# Patient Record
Sex: Female | Born: 1960 | Race: White | Hispanic: No | Marital: Married | Smoking: Current every day smoker
Health system: Southern US, Community
[De-identification: ages and names within clinical notes are randomized; demographics above are authoritative.]

## PROBLEM LIST (undated history)

## (undated) DIAGNOSIS — Z789 Other specified health status: Secondary | ICD-10-CM

## (undated) HISTORY — PX: APPENDECTOMY: SHX54

## (undated) HISTORY — PX: ABDOMINAL HYSTERECTOMY: SHX81

## (undated) HISTORY — PX: CHOLECYSTECTOMY: SHX55

---

## 2002-10-19 ENCOUNTER — Encounter: Payer: Self-pay | Admitting: Family Medicine

## 2002-10-19 ENCOUNTER — Ambulatory Visit (HOSPITAL_COMMUNITY): Admission: RE | Admit: 2002-10-19 | Discharge: 2002-10-19 | Payer: Self-pay | Admitting: Family Medicine

## 2002-10-27 ENCOUNTER — Ambulatory Visit (HOSPITAL_COMMUNITY): Admission: RE | Admit: 2002-10-27 | Discharge: 2002-10-27 | Payer: Self-pay | Admitting: Family Medicine

## 2002-10-27 ENCOUNTER — Encounter: Payer: Self-pay | Admitting: Family Medicine

## 2003-04-05 ENCOUNTER — Ambulatory Visit (HOSPITAL_COMMUNITY): Admission: RE | Admit: 2003-04-05 | Discharge: 2003-04-05 | Payer: Self-pay | Admitting: Family Medicine

## 2003-06-21 ENCOUNTER — Ambulatory Visit (HOSPITAL_COMMUNITY): Admission: RE | Admit: 2003-06-21 | Discharge: 2003-06-21 | Payer: Self-pay | Admitting: Internal Medicine

## 2004-01-31 ENCOUNTER — Ambulatory Visit (HOSPITAL_COMMUNITY): Admission: RE | Admit: 2004-01-31 | Discharge: 2004-01-31 | Payer: Self-pay | Admitting: Family Medicine

## 2005-03-27 ENCOUNTER — Ambulatory Visit (HOSPITAL_COMMUNITY): Admission: RE | Admit: 2005-03-27 | Discharge: 2005-03-27 | Payer: Self-pay | Admitting: Family Medicine

## 2006-07-16 ENCOUNTER — Ambulatory Visit (HOSPITAL_COMMUNITY): Admission: RE | Admit: 2006-07-16 | Discharge: 2006-07-16 | Payer: Self-pay | Admitting: Family Medicine

## 2006-08-03 ENCOUNTER — Encounter: Admission: RE | Admit: 2006-08-03 | Discharge: 2006-08-03 | Payer: Self-pay | Admitting: Family Medicine

## 2006-08-03 ENCOUNTER — Encounter (INDEPENDENT_AMBULATORY_CARE_PROVIDER_SITE_OTHER): Payer: Self-pay | Admitting: Specialist

## 2007-12-14 ENCOUNTER — Encounter: Admission: RE | Admit: 2007-12-14 | Discharge: 2007-12-14 | Payer: Self-pay | Admitting: Family Medicine

## 2009-04-04 ENCOUNTER — Encounter: Admission: RE | Admit: 2009-04-04 | Discharge: 2009-04-04 | Payer: Self-pay | Admitting: Family Medicine

## 2010-06-16 ENCOUNTER — Encounter: Payer: Self-pay | Admitting: Family Medicine

## 2010-07-18 ENCOUNTER — Other Ambulatory Visit: Payer: Self-pay | Admitting: Family Medicine

## 2010-07-18 DIAGNOSIS — Z1231 Encounter for screening mammogram for malignant neoplasm of breast: Secondary | ICD-10-CM

## 2010-08-14 ENCOUNTER — Ambulatory Visit: Payer: Self-pay

## 2010-08-28 ENCOUNTER — Ambulatory Visit
Admission: RE | Admit: 2010-08-28 | Discharge: 2010-08-28 | Disposition: A | Discharge status: Home / Self Care | Payer: BC Managed Care – PPO | Source: Ambulatory Visit | Attending: Family Medicine | Admitting: Family Medicine

## 2010-08-28 DIAGNOSIS — Z1231 Encounter for screening mammogram for malignant neoplasm of breast: Secondary | ICD-10-CM

## 2010-11-13 ENCOUNTER — Other Ambulatory Visit (HOSPITAL_COMMUNITY): Payer: Self-pay | Admitting: Family Medicine

## 2010-11-13 ENCOUNTER — Ambulatory Visit (HOSPITAL_COMMUNITY)
Admission: RE | Admit: 2010-11-13 | Discharge: 2010-11-13 | Disposition: A | Discharge status: Home / Self Care | Payer: BC Managed Care – PPO | Source: Ambulatory Visit | Attending: Family Medicine | Admitting: Family Medicine

## 2010-11-13 DIAGNOSIS — S2341XA Sprain of ribs, initial encounter: Secondary | ICD-10-CM

## 2010-11-13 DIAGNOSIS — R05 Cough: Secondary | ICD-10-CM

## 2010-11-13 DIAGNOSIS — R079 Chest pain, unspecified: Secondary | ICD-10-CM | POA: Insufficient documentation

## 2010-11-13 DIAGNOSIS — J209 Acute bronchitis, unspecified: Secondary | ICD-10-CM

## 2011-09-02 ENCOUNTER — Other Ambulatory Visit (HOSPITAL_COMMUNITY): Payer: Self-pay | Admitting: Internal Medicine

## 2011-09-02 DIAGNOSIS — Z139 Encounter for screening, unspecified: Secondary | ICD-10-CM

## 2011-09-08 ENCOUNTER — Ambulatory Visit (HOSPITAL_COMMUNITY): Payer: BC Managed Care – PPO

## 2011-12-24 ENCOUNTER — Telehealth: Payer: Self-pay

## 2011-12-24 NOTE — Telephone Encounter (Signed)
LMOM to call.

## 2011-12-29 ENCOUNTER — Other Ambulatory Visit: Payer: Self-pay | Admitting: Internal Medicine

## 2011-12-29 DIAGNOSIS — Z1231 Encounter for screening mammogram for malignant neoplasm of breast: Secondary | ICD-10-CM

## 2011-12-29 NOTE — Telephone Encounter (Signed)
LMOM to call.

## 2011-12-30 ENCOUNTER — Telehealth: Payer: Self-pay | Admitting: *Deleted

## 2011-12-30 NOTE — Telephone Encounter (Signed)
Letter to pt and PCP.  

## 2011-12-30 NOTE — Telephone Encounter (Signed)
LMOM to call.

## 2011-12-30 NOTE — Telephone Encounter (Signed)
[name] called today to speak with you about setting up her colonoscopy. Please call her on her cell phone. Thanks.

## 2012-01-01 ENCOUNTER — Telehealth: Payer: Self-pay

## 2012-01-01 DIAGNOSIS — Z139 Encounter for screening, unspecified: Secondary | ICD-10-CM

## 2012-01-01 NOTE — Telephone Encounter (Signed)
See phone note of 01/01/2012. Pt waiting on September schedule.

## 2012-01-05 ENCOUNTER — Other Ambulatory Visit: Payer: Self-pay

## 2012-01-05 DIAGNOSIS — Z139 Encounter for screening, unspecified: Secondary | ICD-10-CM

## 2012-01-05 NOTE — Telephone Encounter (Signed)
LMOM that pt has been scheduled with Dr. Darrick Penna on 02/10/2012 @ 1:10 PM. Please call and let me know which pharmacy to send Rx to and go over some of the instructions.

## 2012-01-05 NOTE — Telephone Encounter (Signed)
Gastroenterology Pre-Procedure Form     Request Date: 01/05/2012      Requesting Physician: Dr. Phillips Odor     PATIENT INFORMATION:  Wendy Bryan is a 51 y.o., female (DOB=[birth year]).  PROCEDURE: Procedure(s) requested: colonoscopy Procedure Reason: screening for colon cancer  PATIENT REVIEW QUESTIONS: The patient reports the following:   1. Diabetes Melitis: no 2. Joint replacements in the past 12 months: no 3. Major health problems in the past 3 months: no 4. Has an artificial valve or MVP:no 5. Has been advised in past to take antibiotics in advance of a procedure like teeth cleaning: no}    MEDICATIONS & ALLERGIES:    Patient reports the following regarding taking any blood thinners:   Plavix? no Aspirin?yes  Coumadin?  no  Patient confirms/reports the following medications:  Current Outpatient Prescriptions  Medication Sig Dispense Refill  . ALPRAZolam (XANAX) 0.5 MG tablet Take 0.5 mg by mouth at bedtime as needed. Pt said she only takes one at bedtime      . naproxen sodium (ANAPROX) 220 MG tablet Take 220 mg by mouth 2 (two) times daily with a meal. Only as needed        Patient confirms/reports the following allergies:  Allergies  Allergen Reactions  . Demerol (Meperidine) Nausea Only    Patient is appropriate to schedule for requested procedure(s): yes  AUTHORIZATION INFORMATION Primary Insurance: ID #:  Group #:  Pre-Cert / Auth required: Pre-Cert / Auth #:   Secondary Insurance: ID #: Group #:  Pre-Cert / Auth required: Pre-Cert / Auth #:  No orders of the defined types were placed in this encounter.    SCHEDULE INFORMATION: Procedure has been scheduled as follows:  Date: 02/10/2012     Time: 1:10 PM  Location: [facility] Short Stay  This Gastroenterology Pre-Precedure Form is being routed to the following provider(s) for review: Jonette Eva, MD

## 2012-01-05 NOTE — Telephone Encounter (Signed)
OK to proceed with colonoscopy.

## 2012-01-05 NOTE — Telephone Encounter (Signed)
Routing to [employer]. Pt called and said she would prefer Dr. Jena Gauss, she just doesn't know Dr. Darrick Penna.

## 2012-01-07 MED ORDER — PEG 3350-KCL-NA BICARB-NACL 420 G PO SOLR: 4000.0000 L | ORAL | Status: AC

## 2012-01-07 NOTE — Telephone Encounter (Signed)
Pt requested to reschedule to 02/11/2012 @ 8:30 AM with RMR. Wendy Bryan is aware.

## 2012-01-07 NOTE — Telephone Encounter (Signed)
Rx sent to [facility]. Instructions mailed to pt.

## 2012-01-14 ENCOUNTER — Encounter (INDEPENDENT_AMBULATORY_CARE_PROVIDER_SITE_OTHER): Payer: Self-pay | Admitting: *Deleted

## 2012-01-20 ENCOUNTER — Ambulatory Visit
Admission: RE | Admit: 2012-01-20 | Discharge: 2012-01-20 | Disposition: A | Discharge status: Home / Self Care | Payer: BC Managed Care – PPO | Source: Ambulatory Visit | Attending: Internal Medicine | Admitting: Internal Medicine

## 2012-01-20 DIAGNOSIS — Z1231 Encounter for screening mammogram for malignant neoplasm of breast: Secondary | ICD-10-CM

## 2012-01-22 ENCOUNTER — Telehealth: Payer: Self-pay

## 2012-01-22 NOTE — Telephone Encounter (Signed)
Pt called to reschedule her colonoscopy with Dr. Jena Gauss on 02/11/2012 to 02/19/2012 @ 9:45 AM due to her work schedule. Selena Batten is aware. I will mail pt new instructions and her Rx was already sent to the Pharmacy.

## 2012-01-23 ENCOUNTER — Other Ambulatory Visit: Payer: Self-pay | Admitting: Internal Medicine

## 2012-01-23 DIAGNOSIS — R928 Other abnormal and inconclusive findings on diagnostic imaging of breast: Secondary | ICD-10-CM

## 2012-01-27 ENCOUNTER — Ambulatory Visit
Admission: RE | Admit: 2012-01-27 | Discharge: 2012-01-27 | Disposition: A | Discharge status: Home / Self Care | Payer: BC Managed Care – PPO | Source: Ambulatory Visit | Attending: Internal Medicine | Admitting: Internal Medicine

## 2012-01-27 DIAGNOSIS — R928 Other abnormal and inconclusive findings on diagnostic imaging of breast: Secondary | ICD-10-CM

## 2012-02-09 ENCOUNTER — Encounter (HOSPITAL_COMMUNITY): Payer: Self-pay | Admitting: Pharmacy Technician

## 2012-02-10 ENCOUNTER — Ambulatory Visit: Admit: 2012-02-10 | Payer: Self-pay | Admitting: Gastroenterology

## 2012-02-10 SURGERY — COLONOSCOPY
Anesthesia: Moderate Sedation

## 2012-02-11 ENCOUNTER — Telehealth: Payer: Self-pay

## 2012-02-11 NOTE — Telephone Encounter (Signed)
LMOM to call to update triage prior to colonoscopy on 02/19/2012.

## 2012-02-12 NOTE — Telephone Encounter (Signed)
Pt called to update triage. She has not had any new problems and no change in meds since she was first triaged on 01/05/2012. She has her instructions and is aware Rx was sent to the pharmacy.

## 2012-02-12 NOTE — Telephone Encounter (Signed)
OK to proceed with colonoscopy.

## 2012-02-18 MED ORDER — SODIUM CHLORIDE 0.45 % IV SOLN
INTRAVENOUS | Status: DC
  Administered 2012-02-19: 11:00:00 via INTRAVENOUS

## 2012-02-19 ENCOUNTER — Ambulatory Visit (HOSPITAL_COMMUNITY)
Admission: RE | Admit: 2012-02-19 | Discharge: 2012-02-19 | Disposition: A | Discharge status: Home / Self Care | Payer: BC Managed Care – PPO | Source: Ambulatory Visit | Attending: Internal Medicine | Admitting: Internal Medicine

## 2012-02-19 ENCOUNTER — Encounter (HOSPITAL_COMMUNITY)
Admission: RE | Disposition: A | Discharge status: Home / Self Care | Payer: Self-pay | Source: Ambulatory Visit | Attending: Internal Medicine

## 2012-02-19 ENCOUNTER — Encounter (HOSPITAL_COMMUNITY): Payer: Self-pay | Admitting: *Deleted

## 2012-02-19 DIAGNOSIS — K5730 Diverticulosis of large intestine without perforation or abscess without bleeding: Secondary | ICD-10-CM

## 2012-02-19 DIAGNOSIS — D126 Benign neoplasm of colon, unspecified: Secondary | ICD-10-CM | POA: Insufficient documentation

## 2012-02-19 DIAGNOSIS — Z1211 Encounter for screening for malignant neoplasm of colon: Secondary | ICD-10-CM

## 2012-02-19 DIAGNOSIS — Z139 Encounter for screening, unspecified: Secondary | ICD-10-CM

## 2012-02-19 HISTORY — DX: Other specified health status: Z78.9

## 2012-02-19 HISTORY — PX: COLONOSCOPY: SHX5424

## 2012-02-19 SURGERY — COLONOSCOPY
Anesthesia: Moderate Sedation

## 2012-02-19 MED ORDER — SODIUM CHLORIDE 0.9 % IJ SOLN
INTRAMUSCULAR | Status: AC
  Filled 2012-02-19: qty 10

## 2012-02-19 MED ORDER — MIDAZOLAM HCL 5 MG/5ML IJ SOLN
INTRAMUSCULAR | Status: DC | PRN
  Administered 2012-02-19 (×2): 2 mg via INTRAVENOUS
  Administered 2012-02-19 (×2): 1 mg via INTRAVENOUS

## 2012-02-19 MED ORDER — STERILE WATER FOR IRRIGATION IR SOLN
Status: DC | PRN
  Administered 2012-02-19: 12:00:00

## 2012-02-19 MED ORDER — PROMETHAZINE HCL 25 MG/ML IJ SOLN
25.0000 mg | Freq: Once | INTRAMUSCULAR | Status: AC
  Administered 2012-02-19: 25 mg via INTRAVENOUS

## 2012-02-19 MED ORDER — FENTANYL CITRATE 0.05 MG/ML IJ SOLN
INTRAMUSCULAR | Status: AC
  Filled 2012-02-19: qty 4

## 2012-02-19 MED ORDER — FENTANYL CITRATE 0.05 MG/ML IJ SOLN
INTRAMUSCULAR | Status: DC | PRN
  Administered 2012-02-19: 50 ug via INTRAVENOUS
  Administered 2012-02-19 (×2): 25 ug via INTRAVENOUS

## 2012-02-19 MED ORDER — PROMETHAZINE HCL 25 MG/ML IJ SOLN
INTRAMUSCULAR | Status: AC
  Filled 2012-02-19: qty 1

## 2012-02-19 MED ORDER — MIDAZOLAM HCL 5 MG/5ML IJ SOLN
INTRAMUSCULAR | Status: AC
  Filled 2012-02-19: qty 10

## 2012-02-19 NOTE — Op Note (Signed)
[facility] [address] [place], [address]   COLONOSCOPY PROCEDURE REPORT  PATIENT: Wendy Bryan, Wendy Bryan  MR#:         [id number] BIRTHDATE: [birth year] , 51  yrs. old GENDER: Female ENDOSCOPIST: R.  Roetta Sessions, MD FACP FACG REFERRED BY:  Assunta Found, M.D. PROCEDURE DATE:  02/19/2012 PROCEDURE:     colonoscopy with snare polypectomy and biopsy  INDICATIONS: First ever average risk screening colonoscopy  INFORMED CONSENT:  The risks, benefits, alternatives and imponderables including but not limited to bleeding, perforation as well as the possibility of a missed lesion have been reviewed.  The potential for biopsy, lesion removal, etc. have also been discussed.  Questions have been answered.  All parties agreeable. Please see the history and physical in the medical record for more information.  MEDICATIONS: Fenatnyl 100 mcg IV and Versed 6 mg IV in divided doses. Phenergan 25 mg IV  DESCRIPTION OF PROCEDURE:  After a digital rectal exam was performed, the EC-[id number] ([id number]) and Pentax Colonoscope [id number] colonoscope was advanced from the anus through the rectum and colon to the area of the cecum, ileocecal valve and appendiceal orifice. The cecum was deeply intubated.  These structures were well-seen and photographed for the record.  From the level of the cecum and ileocecal valve, the scope was slowly and cautiously withdrawn. The mucosal surfaces were carefully surveyed utilizing scope tip deflection to facilitate fold flattening as needed.  The scope was pulled down into the rectum where a thorough examination including retroflexion was performed.    FINDINGS:  suboptimal preparation. External hemorrhoids and anal papilla; otherwise, normal rectum. Scattered left-sided diverticula. Patient had a 3 x 5 mm flat polyp in the base of cecum with another diminutive adjacent cecal polyp; the remainder of the clot this appeared normal.  THERAPEUTIC /  DIAGNOSTIC MANEUVERS PERFORMED:  the larger polyp in the cecum was hot snare removed. A smaller polyp was cold biopsied/removed  COMPLICATIONS: none  CECAL WITHDRAWAL TIME:  12 minutes  IMPRESSION:  Colonic diverticulosis. Cecal polyps-removed as described above  RECOMMENDATIONS: Followup on pathology.   _______________________________ eSigned:  R. Roetta Sessions, MD FACP [facility] 02/19/2012 12:14 PM   CC:    PATIENT NAME:  Wendy Bryan, Wendy Bryan MR#: [id number]

## 2012-02-19 NOTE — H&P (Signed)
  Primary Care Physician:  Colette Ribas, MD Primary Gastroenterologist:  Dr. Jena Gauss  Pre-Procedure History & Physical: HPI:  Wendy Bryan is a 51 y.o. female is here for a screening colonoscopy. No bowel symptoms. No family history of colon cancer. Father may have had polyps but at an advanced age. No prior colonoscopy.  Past Medical History  Diagnosis Date  . No pertinent past medical history     Past Surgical History  Procedure Date  . Abdominal hysterectomy   . Appendectomy   . Cholecystectomy     Prior to Admission medications   Medication Sig Start Date End Date Taking? Authorizing Provider  ALPRAZolam Prudy Feeler) 0.5 MG tablet Take 0.5 mg by mouth at bedtime as needed. Pt said she only takes one at bedtime   Yes Historical Provider, MD  naproxen sodium (ANAPROX) 220 MG tablet Take 220 mg by mouth 2 (two) times daily with a meal. Only as needed   Yes Historical Provider, MD    Allergies as of 01/05/2012 - Review Complete 01/05/2012  Allergen Reaction Noted  . Demerol (meperidine) Nausea Only 01/05/2012    Family History  Problem Relation Age of Onset  . Colon cancer Neg Hx     History   Social History  . Marital Status: Married    Spouse Name: N/A    Number of Children: N/A  . Years of Education: N/A   Occupational History  . Not on file.   Social History Main Topics  . Smoking status: Current Every Day Smoker -- 1.0 packs/day for 30 years    Types: Cigarettes  . Smokeless tobacco: Not on file  . Alcohol Use: Yes     occsional beer and wine  . Drug Use: No  . Sexually Active:    Other Topics Concern  . Not on file   Social History Narrative  . No narrative on file    Review of Systems: See HPI, otherwise negative ROS  Physical Exam: BP 129/90  Pulse 83  Temp 98.2 F (36.8 C) (Oral)  Resp 14  Ht 5' (1.524 m)  Wt 115 lb (52.164 kg)  BMI 22.46 kg/m2  SpO2 99% General:   Alert,  Well-developed, well-nourished, pleasant and cooperative  in NAD Head:  Normocephalic and atraumatic. Eyes:  Sclera clear, no icterus.   Conjunctiva pink. Ears:  Normal auditory acuity. Nose:  No deformity, discharge,  or lesions. Mouth:  No deformity or lesions, dentition normal. Neck:  Supple; no masses or thyromegaly. Lungs:  Clear throughout to auscultation.   No wheezes, crackles, or rhonchi. No acute distress. Heart:  Regular rate and rhythm; no murmurs, clicks, rubs,  or gallops. Abdomen:  Soft, nontender and nondistended. No masses, hepatosplenomegaly or hernias noted. Normal bowel sounds, without guarding, and without rebound.   Msk:  Symmetrical without gross deformities. Normal posture. Pulses:  Normal pulses noted. Extremities:  Without clubbing or edema. Neurologic:  Alert and  oriented x4;  grossly normal neurologically. Skin:  Intact without significant lesions or rashes. Cervical Nodes:  No significant cervical adenopathy. Psych:  Alert and cooperative. Normal mood and affect.  Impression/Plan: Wendy Bryan is now here to undergo a screening colonoscopy.  First ever average risk screening examination.  Risks, benefits, limitations, imponderables and alternatives regarding colonoscopy have been reviewed with the patient. Questions have been answered. All parties agreeable.

## 2012-02-22 ENCOUNTER — Encounter: Payer: Self-pay | Admitting: Internal Medicine

## 2012-02-23 ENCOUNTER — Encounter: Payer: Self-pay | Admitting: *Deleted

## 2012-02-25 ENCOUNTER — Encounter (HOSPITAL_COMMUNITY): Payer: Self-pay | Admitting: Internal Medicine

## 2012-09-29 ENCOUNTER — Ambulatory Visit (HOSPITAL_COMMUNITY)
Admission: RE | Admit: 2012-09-29 | Discharge: 2012-09-29 | Disposition: A | Discharge status: Home / Self Care | Payer: BC Managed Care – PPO | Source: Ambulatory Visit | Attending: Family Medicine | Admitting: Family Medicine

## 2012-09-29 ENCOUNTER — Other Ambulatory Visit (HOSPITAL_COMMUNITY): Payer: Self-pay | Admitting: Family Medicine

## 2012-09-29 DIAGNOSIS — S8990XA Unspecified injury of unspecified lower leg, initial encounter: Secondary | ICD-10-CM | POA: Insufficient documentation

## 2012-09-29 DIAGNOSIS — M79609 Pain in unspecified limb: Secondary | ICD-10-CM | POA: Insufficient documentation

## 2012-09-29 DIAGNOSIS — S99929A Unspecified injury of unspecified foot, initial encounter: Secondary | ICD-10-CM | POA: Insufficient documentation

## 2012-09-29 DIAGNOSIS — M25572 Pain in left ankle and joints of left foot: Secondary | ICD-10-CM

## 2012-09-29 DIAGNOSIS — X58XXXA Exposure to other specified factors, initial encounter: Secondary | ICD-10-CM | POA: Insufficient documentation

## 2013-02-02 ENCOUNTER — Other Ambulatory Visit: Payer: Self-pay

## 2013-02-02 DIAGNOSIS — Z1231 Encounter for screening mammogram for malignant neoplasm of breast: Secondary | ICD-10-CM

## 2013-03-02 ENCOUNTER — Ambulatory Visit: Payer: BC Managed Care – PPO

## 2013-03-23 ENCOUNTER — Other Ambulatory Visit (HOSPITAL_COMMUNITY): Payer: Self-pay | Admitting: Physician Assistant

## 2013-03-23 ENCOUNTER — Other Ambulatory Visit: Payer: Self-pay | Admitting: Physician Assistant

## 2013-03-23 DIAGNOSIS — N63 Unspecified lump in unspecified breast: Secondary | ICD-10-CM

## 2013-04-12 ENCOUNTER — Ambulatory Visit: Payer: BC Managed Care – PPO

## 2013-09-01 ENCOUNTER — Ambulatory Visit (INDEPENDENT_AMBULATORY_CARE_PROVIDER_SITE_OTHER): Payer: BC Managed Care – PPO | Admitting: Otolaryngology

## 2013-09-01 DIAGNOSIS — H6120 Impacted cerumen, unspecified ear: Secondary | ICD-10-CM

## 2013-09-01 DIAGNOSIS — H903 Sensorineural hearing loss, bilateral: Secondary | ICD-10-CM

## 2013-09-01 DIAGNOSIS — H6980 Other specified disorders of Eustachian tube, unspecified ear: Secondary | ICD-10-CM

## 2014-12-21 ENCOUNTER — Ambulatory Visit (HOSPITAL_COMMUNITY)
Admission: RE | Admit: 2014-12-21 | Discharge: 2014-12-21 | Disposition: A | Discharge status: Home / Self Care | Payer: Commercial Managed Care - HMO | Source: Ambulatory Visit | Attending: Internal Medicine | Admitting: Internal Medicine

## 2014-12-21 ENCOUNTER — Other Ambulatory Visit (HOSPITAL_COMMUNITY): Payer: Self-pay | Admitting: Internal Medicine

## 2014-12-21 DIAGNOSIS — R42 Dizziness and giddiness: Secondary | ICD-10-CM | POA: Insufficient documentation

## 2014-12-21 DIAGNOSIS — I10 Essential (primary) hypertension: Secondary | ICD-10-CM

## 2014-12-21 DIAGNOSIS — H538 Other visual disturbances: Secondary | ICD-10-CM | POA: Insufficient documentation

## 2014-12-21 DIAGNOSIS — R030 Elevated blood-pressure reading, without diagnosis of hypertension: Secondary | ICD-10-CM | POA: Diagnosis not present

## 2014-12-21 DIAGNOSIS — G319 Degenerative disease of nervous system, unspecified: Secondary | ICD-10-CM | POA: Insufficient documentation

## 2014-12-21 DIAGNOSIS — H539 Unspecified visual disturbance: Secondary | ICD-10-CM

## 2015-03-01 ENCOUNTER — Ambulatory Visit: Payer: Commercial Managed Care - HMO | Admitting: Neurology

## 2015-04-05 ENCOUNTER — Telehealth: Payer: Self-pay | Admitting: Family Medicine

## 2015-04-05 ENCOUNTER — Encounter: Payer: Self-pay | Admitting: Neurology

## 2015-04-05 ENCOUNTER — Ambulatory Visit (INDEPENDENT_AMBULATORY_CARE_PROVIDER_SITE_OTHER): Payer: Commercial Managed Care - HMO | Admitting: Neurology

## 2015-04-05 VITALS — BP 130/90 | HR 89 | Ht 60.0 in | Wt 107.4 lb

## 2015-04-05 DIAGNOSIS — R9089 Other abnormal findings on diagnostic imaging of central nervous system: Secondary | ICD-10-CM

## 2015-04-05 DIAGNOSIS — G319 Degenerative disease of nervous system, unspecified: Secondary | ICD-10-CM | POA: Diagnosis not present

## 2015-04-05 DIAGNOSIS — R930 Abnormal findings on diagnostic imaging of skull and head, not elsewhere classified: Secondary | ICD-10-CM | POA: Diagnosis not present

## 2015-04-05 DIAGNOSIS — F17200 Nicotine dependence, unspecified, uncomplicated: Secondary | ICD-10-CM

## 2015-04-05 NOTE — Telephone Encounter (Signed)
Called patient and left msg on voicemail about MRI Brain appt. She has been scheduled at [facility] for 04/24/15 @ 7:00 pm for a 6:30 pm arrival. I did also leave her the scheduling telephone number to call if she needed to change her appt. [phone].

## 2015-04-05 NOTE — Progress Notes (Signed)
NEUROLOGY CONSULTATION NOTE  Wendy Bryan MRN: [id number] DOB: 07/19/1960  Referring provider: Dr. Gerarda Fraction Primary care provider: Dr. Gerarda Fraction  Reason for consult:  Abnormal CT of head  HISTORY OF PRESENT ILLNESS: Wendy Bryan is a 54 year old right-handed female with hypertension, tobacco abuse and anxiety who presents for abnormal CT of head.  History obtained by patient and PCP note.  Images of head CT personally reviewed..  Wendy Bryan has been under a lot of stress over the past 2 years.  Her husband has epilepsy related to an autoimmune encephalopathy.  She is needing to care for her elderly parents.  She is also having work-related stress.  She has suffered some panic attacks.  One day, she had a panic attack in which she started shaking and experienced blurred vision.  Her blood pressure was elevated.  A stroke was suspected, so she had a CT of the head without contrast performed on 12/21/14, which showed age-advanced bilateral frontal lobe atrophy but no acute intracranial abnormality.  She reports no problems with memory, impulsivity, apathy, inappropriate behavior, or performing daily activities.  She notes some dizziness but no gait instability.  She does have problems concentrating, however it could be related to anxiety.  She denies history of head trauma.  She denies family history of dementia.  PAST MEDICAL HISTORY: Past Medical History  Diagnosis Date  . No pertinent past medical history     PAST SURGICAL HISTORY: Past Surgical History  Procedure Laterality Date  . Abdominal hysterectomy    . Appendectomy    . Cholecystectomy    . Colonoscopy  02/19/2012    Procedure: COLONOSCOPY;  Surgeon: Daneil Dolin, MD;  Location: [facility];  Service: Endoscopy;  Laterality: N/A;  9:45 AM    MEDICATIONS: Current Outpatient Prescriptions on File Prior to Visit  Medication Sig Dispense Refill  . naproxen sodium (ANAPROX) 220 MG tablet Take 220 mg by mouth 2 (two)  times daily with a meal. Only as needed     No current facility-administered medications on file prior to visit.    ALLERGIES: Allergies  Allergen Reactions  . Demerol [Meperidine] Nausea Only    FAMILY HISTORY: Family History  Problem Relation Age of Onset  . Colon cancer Neg Hx     SOCIAL HISTORY: Social History   Social History  . Marital Status: Married    Spouse Name: N/A  . Number of Children: N/A  . Years of Education: N/A   Occupational History  . Not on file.   Social History Main Topics  . Smoking status: Current Every Day Smoker -- 1.00 packs/day for 30 years    Types: Cigarettes  . Smokeless tobacco: Never Used  . Alcohol Use: 0.0 oz/week    0 Standard drinks or equivalent per week     Comment: occsional beer and wine  . Drug Use: No  . Sexual Activity: Not on file   Other Topics Concern  . Not on file   Social History Narrative   Lives with husband and son in a one story home.  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  Education: high school, some college.    REVIEW OF SYSTEMS: Constitutional: No fevers, chills, or sweats, no generalized fatigue, change in appetite Eyes: No visual changes, double vision, eye pain Ear, nose and throat: No hearing loss, ear pain, nasal congestion, sore throat Cardiovascular: No chest pain, palpitations Respiratory:  No shortness of breath at rest or with exertion, wheezes GastrointestinaI:  No nausea, vomiting, diarrhea, abdominal pain, fecal incontinence Genitourinary:  No dysuria, urinary retention or frequency Musculoskeletal:  No neck pain, back pain Integumentary: No rash, pruritus, skin lesions Neurological: as above Psychiatric: No depression, insomnia, anxiety Endocrine: No palpitations, fatigue, diaphoresis, mood swings, change in appetite, change in weight, increased thirst Hematologic/Lymphatic:  No anemia, purpura, petechiae. Allergic/Immunologic: no itchy/runny eyes, nasal congestion, recent allergic  reactions, rashes  PHYSICAL EXAM: Filed Vitals:   04/05/15 1022  BP: 130/90  Pulse: 89   General: No acute distress.  Patient appears well-groomed.  Head:  Normocephalic/atraumatic Eyes:  fundi unremarkable, without vessel changes, exudates, hemorrhages or papilledema. Neck: supple, no paraspinal tenderness, full range of motion Back: No paraspinal tenderness Heart: regular rate and rhythm Lungs: Clear to auscultation bilaterally. Vascular: No carotid bruits. Neurological Exam: Mental status: alert and oriented to person, place, and time, recent and remote memory intact, fund of knowledge intact, attention and concentration intact, speech fluent and not dysarthric, language intact.  She incorrectly performed the Trail Making Test and copying a cube. Montreal Cognitive Assessment  04/05/2015  Visuospatial/ Executive (0/5) 3  Naming (0/3) 3  Attention: Read list of digits (0/2) 1  Attention: Read list of letters (0/1) 1  Attention: Serial 7 subtraction starting at 100 (0/3) 3  Language: Repeat phrase (0/2) 2  Language : Fluency (0/1) 1  Abstraction (0/2) 2  Delayed Recall (0/5) 5  Orientation (0/6) 6  Total 27  Adjusted Score (based on education) 27   Cranial nerves: CN I: not tested CN II: pupils equal, round and reactive to light, visual fields intact, fundi unremarkable, without vessel changes, exudates, hemorrhages or papilledema. CN III, IV, VI:  full range of motion, no nystagmus, no ptosis CN V: facial sensation intact CN VII: upper and lower face symmetric CN VIII: hearing intact CN IX, X: gag intact, uvula midline CN XI: sternocleidomastoid and trapezius muscles intact CN XII: tongue midline Bulk & Tone: normal, no fasciculations. Motor:  5/5 throughout  Sensation: temperature and vibration sensation intact. Deep Tendon Reflexes:  2+ throughout, toes downgoing.  Finger to nose testing:  Without dysmetria.  Heel to shin:  Without dysmetria.  Gait:  Normal station  and stride.  Able to turn and tandem walk. Romberg negative.  IMPRESSION: 1.  Bilateral frontal lobe atrophy.  It may be seen in frontotemporal dementia, but she does not exhibit any definite symptoms of cognitive impairment.  Her MOCA score fell within range, however the minor deficits did involve visuospatial and executive functioning.  Also, it is uncertain how much anxiety is playing a role.  However, it may also be of no clinical significance. 2.  Anxiety  PLAN: 1. Will get MRI of the brain to evaluate the brain better, such as evaluation of the temporal lobes. 2.  I asked her to consider formal neuropsychological testing.  She will let us know if she wishes to pursue this.  Otherwise, I would re-evaluate in the office in 9 to 12 months. 3.  Smoking cessation  Thank you for allowing me to take part in the care of this patient.  Metta Clines, DO  CC:  Redmond School, MD

## 2015-04-05 NOTE — Addendum Note (Signed)
Addended by: Thurmon Fair on: 04/05/2015 12:13 PM   Modules accepted: Orders

## 2015-04-05 NOTE — Patient Instructions (Signed)
Clinically, you are doing well.  The findings on CT may be of no clinical relevance.  However, I would like to perform a couple of tests: 1.  MRI of the brain 2.  Please consider formal neuropsychological testing.  If you would like to pursue this, we will set this up.  Otherwise, we can re-evaluate in 9 to 12 months

## 2015-04-05 NOTE — Progress Notes (Signed)
Note routed

## 2015-04-24 ENCOUNTER — Ambulatory Visit (HOSPITAL_COMMUNITY): Payer: Commercial Managed Care - HMO

## 2015-08-03 ENCOUNTER — Other Ambulatory Visit (HOSPITAL_COMMUNITY): Payer: Self-pay | Admitting: Internal Medicine

## 2015-08-03 DIAGNOSIS — Z1231 Encounter for screening mammogram for malignant neoplasm of breast: Secondary | ICD-10-CM

## 2015-08-06 ENCOUNTER — Ambulatory Visit (HOSPITAL_COMMUNITY): Payer: Commercial Managed Care - HMO

## 2015-08-13 ENCOUNTER — Ambulatory Visit (HOSPITAL_COMMUNITY): Payer: Commercial Managed Care - HMO

## 2016-02-25 ENCOUNTER — Ambulatory Visit (HOSPITAL_COMMUNITY): Payer: Commercial Managed Care - HMO

## 2016-03-20 ENCOUNTER — Other Ambulatory Visit: Payer: Self-pay | Admitting: Internal Medicine

## 2016-03-20 DIAGNOSIS — Z1231 Encounter for screening mammogram for malignant neoplasm of breast: Secondary | ICD-10-CM

## 2016-04-10 ENCOUNTER — Ambulatory Visit
Admission: RE | Admit: 2016-04-10 | Discharge: 2016-04-10 | Disposition: A | Discharge status: Home / Self Care | Payer: Commercial Managed Care - HMO | Source: Ambulatory Visit | Attending: Internal Medicine | Admitting: Internal Medicine

## 2016-04-10 DIAGNOSIS — Z1231 Encounter for screening mammogram for malignant neoplasm of breast: Secondary | ICD-10-CM

## 2017-01-07 ENCOUNTER — Encounter: Payer: Self-pay | Admitting: Internal Medicine

## 2018-05-15 ENCOUNTER — Ambulatory Visit (HOSPITAL_COMMUNITY)
Admission: EM | Admit: 2018-05-15 | Discharge: 2018-05-15 | Disposition: A | Discharge status: Home / Self Care | Payer: 59 | Attending: Family Medicine | Admitting: Family Medicine

## 2018-05-15 ENCOUNTER — Encounter (HOSPITAL_COMMUNITY): Payer: Self-pay

## 2018-05-15 ENCOUNTER — Other Ambulatory Visit: Payer: Self-pay

## 2018-05-15 DIAGNOSIS — N898 Other specified noninflammatory disorders of vagina: Secondary | ICD-10-CM

## 2018-05-15 DIAGNOSIS — F17210 Nicotine dependence, cigarettes, uncomplicated: Secondary | ICD-10-CM | POA: Insufficient documentation

## 2018-05-15 DIAGNOSIS — N760 Acute vaginitis: Secondary | ICD-10-CM | POA: Insufficient documentation

## 2018-05-15 DIAGNOSIS — L258 Unspecified contact dermatitis due to other agents: Secondary | ICD-10-CM | POA: Insufficient documentation

## 2018-05-15 DIAGNOSIS — Z885 Allergy status to narcotic agent status: Secondary | ICD-10-CM | POA: Diagnosis not present

## 2018-05-15 DIAGNOSIS — L2489 Irritant contact dermatitis due to other agents: Secondary | ICD-10-CM

## 2018-05-15 MED ORDER — FLUCONAZOLE 100 MG PO TABS: 100.0000 mg | ORAL_TABLET | Freq: Every day | ORAL | 0 refills | Status: DC

## 2018-05-15 MED ORDER — PREDNISONE 10 MG (21) PO TBPK: ORAL_TABLET | Freq: Every day | ORAL | 0 refills | Status: DC

## 2018-05-15 NOTE — ED Triage Notes (Signed)
Pt cc she  has a  yeast infection. She has had 2 rounds on fluconazole 150 mg. She has tried vaginal cream. Pt states she still has a yeast infection. This started 12 /03/19 pt had been  taking a antibiotic.

## 2018-05-15 NOTE — Discharge Instructions (Addendum)
We have sent testing for other causes of vaginal itching and discharge. We will notify you of any positive results once they are received. If required, we will prescribe any medications you might need.  Stop using any over-the-counter medicated wipes. These may be causing a contact allergy/skin irritation of your groin and inner thighs.

## 2018-05-15 NOTE — ED Provider Notes (Signed)
[place]   [id number] 05/15/18 Arrival Time: 7062  ASSESSMENT & PLAN:  1. Acute vaginitis   2. Itching in the vaginal area   3. Irritant contact dermatitis due to other agents    Discharge Instructions     We have sent testing for other causes of vaginal itching and discharge. We will notify you of any positive results once they are received. If required, we will prescribe any medications you might need.  Stop using any over-the-counter medicated wipes. These may be causing a contact allergy/skin irritation of your groin and inner thighs.  Pending: Labs Reviewed  CERVICOVAGINAL ANCILLARY ONLY   Will notify of any positive results. Suspect the OTC medicated wipe may be causing a contact-like dermatitis. Discussed.  Meds ordered this encounter  Medications  . fluconazole (DIFLUCAN) 100 MG tablet    Sig: Take 1 tablet (100 mg total) by mouth daily.    Dispense:  10 tablet    Refill:  0  . predniSONE (STERAPRED UNI-PAK 21 TAB) 10 MG (21) TBPK tablet    Sig: Take by mouth daily. Take as directed.    Dispense:  21 tablet    Refill:  0   Plans f/u with her PCP.  Reviewed expectations re: course of current medical issues. Questions answered. Outlined signs and symptoms indicating need for more acute intervention. Patient verbalized understanding. After Visit Summary given.   SUBJECTIVE:  Wendy Bryan is a 57 y.o. female who presents with complaint of vaginal discharge and vaginal itching. Onset gradual, over the past few weeks. Describes discharge as thick white; a little better. Mainly the itching of her vagina and surrounding skin is bothering her the most. Has taken Diflucan twice; Rx by PCP; "helps but then the itching comes back". Urinary symptoms: none. Afebrile. No abdominal or pelvic pain. No n/v. No rashes or lesions. No recent sexual activity OTC treatment: Has been using a medicated OTC "wipe" around vaginal area. History of STI: No.  No LMP  recorded. Patient has had a hysterectomy.  ROS: As per HPI.  OBJECTIVE:  Vitals:   05/15/18 1131 05/15/18 1144  BP: (!) 136/92   Pulse: 80   Resp: 14   Temp: 98.3 F (36.8 C)   TempSrc: Oral   SpO2: 98%   Weight:  46.7 kg     General appearance: alert, cooperative, appears stated age and no distress Throat: lips, mucosa, and tongue normal; teeth and gums normal Cv: RRR Lungs: CTAB Back: no CVA tenderness; FROM at waist Abdomen: soft, non-tender GU: significant and diffuse erythema around vagina extending to inner thighs and above pubis; flat; no wounds or lesions; no vesicles or ulcers; I can see where she has been scratching Skin: warm and dry Psychological: alert and cooperative; normal mood and affect.   Labs Reviewed  CERVICOVAGINAL ANCILLARY ONLY    Allergies  Allergen Reactions  . Demerol [Meperidine] Nausea Only    Past Medical History:  Diagnosis Date  . No pertinent past medical history    Family History  Problem Relation Age of Onset  . Colon cancer Neg Hx    Social History   Socioeconomic History  . Marital status: Married    Spouse name: Not on file  . Number of children: Not on file  . Years of education: Not on file  . Highest education level: Not on file  Occupational History  . Not on file  Social Needs  . Financial resource strain: Not on file  . Food  insecurity:    Worry: Not on file    Inability: Not on file  . Transportation needs:    Medical: Not on file    Non-medical: Not on file  Tobacco Use  . Smoking status: Current Every Day Smoker    Packs/day: 1.00    Years: 30.00    Pack years: 30.00    Types: Cigarettes  . Smokeless tobacco: Never Used  Substance and Sexual Activity  . Alcohol use: Yes    Alcohol/week: 0.0 standard drinks    Comment: occsional beer and wine  . Drug use: No  . Sexual activity: Not on file  Lifestyle  . Physical activity:    Days per week: Not on file    Minutes per session: Not on file  .  Stress: Not on file  Relationships  . Social connections:    Talks on phone: Not on file    Gets together: Not on file    Attends religious service: Not on file    Active member of club or organization: Not on file    Attends meetings of clubs or organizations: Not on file    Relationship status: Not on file  . Intimate partner violence:    Fear of current or ex partner: Not on file    Emotionally abused: Not on file    Physically abused: Not on file    Forced sexual activity: Not on file  Other Topics Concern  . Not on file  Social History Narrative   Lives with husband and son in a one story home.  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  Education: high school, some college.          Vanessa Kick, MD 05/17/18 [phone]

## 2018-05-17 LAB — CERVICOVAGINAL ANCILLARY ONLY
Bacterial vaginitis: NEGATIVE
CANDIDA VAGINITIS: NEGATIVE
TRICH (WINDOWPATH): NEGATIVE

## 2018-05-24 ENCOUNTER — Telehealth (HOSPITAL_COMMUNITY): Payer: Self-pay | Admitting: Emergency Medicine

## 2018-05-24 NOTE — Telephone Encounter (Signed)
Patient called asking about test results, told her all results are negative. Pt states she feels better just wanted to know her results. All questions answered.

## 2018-05-31 ENCOUNTER — Ambulatory Visit (HOSPITAL_COMMUNITY)
Admission: EM | Admit: 2018-05-31 | Discharge: 2018-05-31 | Disposition: A | Discharge status: Home / Self Care | Payer: 59 | Attending: Internal Medicine | Admitting: Internal Medicine

## 2018-05-31 ENCOUNTER — Encounter (HOSPITAL_COMMUNITY): Payer: Self-pay

## 2018-05-31 ENCOUNTER — Other Ambulatory Visit: Payer: Self-pay

## 2018-05-31 ENCOUNTER — Ambulatory Visit (INDEPENDENT_AMBULATORY_CARE_PROVIDER_SITE_OTHER): Payer: 59

## 2018-05-31 DIAGNOSIS — R062 Wheezing: Secondary | ICD-10-CM | POA: Diagnosis not present

## 2018-05-31 DIAGNOSIS — J40 Bronchitis, not specified as acute or chronic: Secondary | ICD-10-CM | POA: Insufficient documentation

## 2018-05-31 DIAGNOSIS — R05 Cough: Secondary | ICD-10-CM | POA: Diagnosis not present

## 2018-05-31 MED ORDER — METHYLPREDNISOLONE 4 MG PO TBPK: ORAL_TABLET | ORAL | 0 refills | Status: DC

## 2018-05-31 MED ORDER — FLUCONAZOLE 150 MG PO TABS: 150.0000 mg | ORAL_TABLET | Freq: Every day | ORAL | 0 refills | Status: DC

## 2018-05-31 MED ORDER — IPRATROPIUM-ALBUTEROL 0.5-2.5 (3) MG/3ML IN SOLN: 3.0000 mL | Freq: Once | RESPIRATORY_TRACT | Status: DC

## 2018-05-31 NOTE — ED Triage Notes (Signed)
Pt cc coughing and chest congestion.x 2 weeks. Pt states she has a yeast infection. Pt states she has been taking the antibiotics and they are not working.

## 2018-05-31 NOTE — Discharge Instructions (Addendum)
Keep on taking your antibiotic and use the inhaler every 4 hours for 6 days. I am adding prednisone to help you get better. Follow with your family Dr  on Friday.

## 2018-05-31 NOTE — ED Provider Notes (Addendum)
[facility]    CSN: [id number] Arrival date & time: 05/31/18  1501     History   Chief Complaint Chief Complaint  Patient presents with  . Cough    HPI Wendy Bryan is a 58 y.o. female.    Pt has been having chest congestion 2 weeks ago and her PCP called her in on 05/27/18 Doxy and inhaler and is getting vaginal itching. Has tightness and heaviness in her chest and been wheezing a lot. She smokes 3/4 ppd.     Past Medical History:  Diagnosis Date  . No pertinent past medical history     Patient Active Problem List   Diagnosis Date Noted  . Tobacco dependence 04/05/2015    Past Surgical History:  Procedure Laterality Date  . ABDOMINAL HYSTERECTOMY    . APPENDECTOMY    . CHOLECYSTECTOMY    . COLONOSCOPY  02/19/2012   Procedure: COLONOSCOPY;  Surgeon: Daneil Dolin, MD;  Location: [facility];  Service: Endoscopy;  Laterality: N/A;  9:45 AM    OB History   No obstetric history on file.      Home Medications    Prior to Admission medications   Medication Sig Start Date End Date Taking? Authorizing Provider  ALPRAZolam Duanne Moron) 1 MG tablet  03/31/15   [provider]  fluconazole (DIFLUCAN) 100 MG tablet Take 1 tablet (100 mg total) by mouth daily. 05/15/18   Vanessa Kick, MD  fluconazole (DIFLUCAN) 150 MG tablet Take 1 tablet (150 mg total) by mouth daily. 05/31/18   Rodriguez-Southworth, Sunday Spillers, PA-C  methylPREDNISolone (MEDROL DOSEPAK) 4 MG TBPK tablet Take as directed per pack 05/31/18   Rodriguez-Southworth, Sunday Spillers, PA-C  naproxen sodium (ANAPROX) 220 MG tablet Take 220 mg by mouth 2 (two) times daily with a meal. Only as needed    [provider]  predniSONE (STERAPRED UNI-PAK 21 TAB) 10 MG (21) TBPK tablet Take by mouth daily. Take as directed. 05/15/18   Vanessa Kick, MD  venlafaxine XR (EFFEXOR-XR) 37.5 MG 24 hr capsule  02/26/15   [provider]    Family History Family History  Problem Relation Age of Onset    . Colon cancer Neg Hx     Social History Social History   Tobacco Use  . Smoking status: Current Every Day Smoker    Packs/day: 1.00    Years: 30.00    Pack years: 30.00    Types: Cigarettes  . Smokeless tobacco: Never Used  Substance Use Topics  . Alcohol use: Yes    Alcohol/week: 0.0 standard drinks    Comment: occsional beer and wine  . Drug use: No     Allergies   Demerol [meperidine]   Review of Systems Review of Systems  Constitutional: Positive for chills, diaphoresis, fatigue and fever. Negative for activity change and appetite change.       Hd fever of 102 4 days ago  HENT: Positive for postnasal drip. Negative for congestion, ear discharge, ear pain, rhinorrhea and sore throat.   Eyes: Negative for discharge.  Respiratory: Positive for cough, chest tightness and wheezing. Negative for shortness of breath.   Cardiovascular: Negative for chest pain, palpitations and leg swelling.  Gastrointestinal: Negative for abdominal pain, diarrhea, nausea and vomiting.       Had N/V/D 3 weeks ago, but has resolved  Genitourinary: Negative for difficulty urinating.  Musculoskeletal: Positive for myalgias. Negative for arthralgias and joint swelling.       Had body aches 4  days ago  Skin: Negative for rash.  Neurological: Positive for weakness. Negative for speech difficulty, light-headedness and headaches.  Hematological: Negative for adenopathy.  Psychiatric/Behavioral:       Has been under a lot of stress due to her husband dealing with a fractured hand from a fall from the latter.    Physical Exam Triage Vital Signs ED Triage Vitals  Enc Vitals Group     BP 05/31/18 1527 119/89     Pulse Rate 05/31/18 1527 85     Resp 05/31/18 1527 18     Temp 05/31/18 1527 99 F (37.2 C)     Temp Source 05/31/18 1527 Oral     SpO2 05/31/18 1527 100 %     Weight 05/31/18 1525 98 lb (44.5 kg)     Height --      Head Circumference --      Peak Flow --      Pain Score 05/31/18  1525 8     Pain Loc --      Pain Edu? --      Excl. in [place]? --    No data found.  Updated Vital Signs BP 119/89 (BP Location: Right Arm)   Pulse 85   Temp 99 F (37.2 C) (Oral)   Resp 18   Wt 98 lb (44.5 kg)   SpO2 100%   BMI 19.14 kg/m    Visual Acuity Right Eye Distance:   Left Eye Distance:   Bilateral Distance:    Right Eye Near:   Left Eye Near:    Bilateral Near:     Physical Exam Vitals signs and nursing note reviewed.  Constitutional:      General: She is not in acute distress.    Appearance: She is not toxic-appearing.  HENT:     Head: Normocephalic and atraumatic.     Right Ear: Tympanic membrane, ear canal and external ear normal.     Left Ear: Tympanic membrane, ear canal and external ear normal.     Nose: Nose normal. No congestion or rhinorrhea.     Mouth/Throat:     Mouth: Mucous membranes are moist.     Pharynx: No oropharyngeal exudate or posterior oropharyngeal erythema.  Eyes:     General: No scleral icterus.    Conjunctiva/sclera: Conjunctivae normal.  Neck:     Musculoskeletal: Neck supple. No neck rigidity.  Cardiovascular:     Rate and Rhythm: Normal rate and regular rhythm.     Heart sounds: No murmur.  Pulmonary:     Effort: Pulmonary effort is normal.     Breath sounds: Wheezing present. No rhonchi or rales.     Comments: Has diffuse expiratory wheezing throughout Musculoskeletal: Normal range of motion.  Lymphadenopathy:     Cervical: No cervical adenopathy.  Skin:    General: Skin is warm and dry.     Findings: No rash.  Neurological:     Mental Status: She is alert and oriented to person, place, and time.  Psychiatric:        Mood and Affect: Mood normal.        Behavior: Behavior normal.        Thought Content: Thought content normal.        Judgment: Judgment normal.      UC Treatments / Results  Labs (all labs ordered are listed, but only abnormal results are displayed) Labs Reviewed - No data to  display  EKG None  Radiology Negative CXR  Procedures  Procedures   Medications Ordered in UC Medications - No data to display  Initial Impression / Assessment and Plan / UC Course  I have reviewed the triage vital signs and the nursing notes. Pertinent  imaging results that were available during my care of the patient were reviewed by me and considered in my medical decision making (see chart for details). I told her to continue the Doxy and to use the Albuterol inhaler q 4h x 5 days, then prn and I placed her on Medrol dose pack.  She is prone to yeast infections with antibiotics, so I sent Diflucan for her.  Final Clinical Impressions(s) / UC Diagnoses   Final diagnoses:  Bronchitis     Discharge Instructions     Keep on taking your antibiotic and use the inhaler every 4 hours for 6 days. I am adding prednisone to help you get better. Follow with your family Dr  on Friday.     ED Prescriptions    Medication Sig Dispense Auth. Provider   methylPREDNISolone (MEDROL DOSEPAK) 4 MG TBPK tablet Take as directed per pack 21 tablet Rodriguez-Southworth, Sunday Spillers, PA-C   fluconazole (DIFLUCAN) 150 MG tablet Take 1 tablet (150 mg total) by mouth daily. 2 tablet Rodriguez-Southworth, Sunday Spillers, PA-C     Controlled Substance Prescriptions [place] Controlled Substance Registry consulted?   Shelby Mattocks, PA-C 05/31/18 1757  Rodriguez-Southworth, Sunday Spillers, PA-C 05/31/18 1758

## 2018-06-04 ENCOUNTER — Encounter: Payer: Self-pay | Admitting: Adult Health

## 2018-08-12 ENCOUNTER — Telehealth (HOSPITAL_COMMUNITY): Payer: Self-pay | Admitting: Emergency Medicine

## 2018-08-12 ENCOUNTER — Telehealth: Payer: Self-pay | Admitting: Emergency Medicine

## 2018-08-16 LAB — NOVEL CORONAVIRUS, NAA: SARS-CoV-2, NAA: NOT DETECTED

## 2019-02-10 ENCOUNTER — Other Ambulatory Visit (HOSPITAL_COMMUNITY): Payer: Self-pay | Admitting: Family Medicine

## 2019-02-10 DIAGNOSIS — E2839 Other primary ovarian failure: Secondary | ICD-10-CM

## 2019-02-10 DIAGNOSIS — Z1231 Encounter for screening mammogram for malignant neoplasm of breast: Secondary | ICD-10-CM

## 2019-11-10 ENCOUNTER — Other Ambulatory Visit (HOSPITAL_COMMUNITY): Payer: Self-pay | Admitting: Family Medicine

## 2019-11-10 DIAGNOSIS — E2839 Other primary ovarian failure: Secondary | ICD-10-CM

## 2019-11-10 DIAGNOSIS — Z1231 Encounter for screening mammogram for malignant neoplasm of breast: Secondary | ICD-10-CM

## 2020-02-09 ENCOUNTER — Other Ambulatory Visit: Payer: Self-pay | Admitting: Family Medicine

## 2020-02-09 DIAGNOSIS — Z1231 Encounter for screening mammogram for malignant neoplasm of breast: Secondary | ICD-10-CM

## 2020-02-09 DIAGNOSIS — E2839 Other primary ovarian failure: Secondary | ICD-10-CM

## 2020-03-13 ENCOUNTER — Ambulatory Visit
Admission: RE | Admit: 2020-03-13 | Discharge: 2020-03-13 | Disposition: A | Discharge status: Home / Self Care | Payer: 59 | Source: Ambulatory Visit | Attending: Family Medicine | Admitting: Family Medicine

## 2020-03-13 ENCOUNTER — Other Ambulatory Visit: Payer: Self-pay

## 2020-03-13 DIAGNOSIS — Z1231 Encounter for screening mammogram for malignant neoplasm of breast: Secondary | ICD-10-CM

## 2020-05-24 ENCOUNTER — Other Ambulatory Visit: Payer: 59

## 2020-07-09 ENCOUNTER — Ambulatory Visit: Payer: 59 | Admitting: Cardiovascular Disease

## 2021-03-12 ENCOUNTER — Other Ambulatory Visit (HOSPITAL_COMMUNITY): Payer: Self-pay | Admitting: Internal Medicine

## 2021-03-12 ENCOUNTER — Other Ambulatory Visit: Payer: Self-pay

## 2021-03-12 ENCOUNTER — Ambulatory Visit (HOSPITAL_COMMUNITY)
Admission: RE | Admit: 2021-03-12 | Discharge: 2021-03-12 | Disposition: A | Discharge status: Home / Self Care | Payer: PRIVATE HEALTH INSURANCE | Source: Ambulatory Visit | Attending: Internal Medicine | Admitting: Internal Medicine

## 2021-03-12 DIAGNOSIS — R059 Cough, unspecified: Secondary | ICD-10-CM | POA: Insufficient documentation

## 2021-09-10 ENCOUNTER — Ambulatory Visit (HOSPITAL_BASED_OUTPATIENT_CLINIC_OR_DEPARTMENT_OTHER): Payer: PRIVATE HEALTH INSURANCE | Admitting: Cardiovascular Disease

## 2021-09-10 ENCOUNTER — Encounter (HOSPITAL_BASED_OUTPATIENT_CLINIC_OR_DEPARTMENT_OTHER): Payer: Self-pay

## 2021-09-26 ENCOUNTER — Ambulatory Visit (HOSPITAL_BASED_OUTPATIENT_CLINIC_OR_DEPARTMENT_OTHER): Payer: PRIVATE HEALTH INSURANCE | Admitting: Cardiology

## 2021-10-15 ENCOUNTER — Encounter (HOSPITAL_BASED_OUTPATIENT_CLINIC_OR_DEPARTMENT_OTHER): Payer: Self-pay | Admitting: Cardiovascular Disease

## 2021-10-15 ENCOUNTER — Ambulatory Visit (INDEPENDENT_AMBULATORY_CARE_PROVIDER_SITE_OTHER): Payer: PRIVATE HEALTH INSURANCE | Admitting: Cardiovascular Disease

## 2021-10-15 DIAGNOSIS — R079 Chest pain, unspecified: Secondary | ICD-10-CM

## 2021-10-15 DIAGNOSIS — I209 Angina pectoris, unspecified: Secondary | ICD-10-CM | POA: Insufficient documentation

## 2021-10-15 DIAGNOSIS — F17200 Nicotine dependence, unspecified, uncomplicated: Secondary | ICD-10-CM

## 2021-10-15 DIAGNOSIS — E7800 Pure hypercholesterolemia, unspecified: Secondary | ICD-10-CM

## 2021-10-15 HISTORY — DX: Chest pain, unspecified: R07.9

## 2021-10-15 HISTORY — DX: Pure hypercholesterolemia, unspecified: E78.00

## 2021-10-15 MED ORDER — METOPROLOL TARTRATE 50 MG PO TABS: ORAL_TABLET | ORAL | 0 refills | Status: DC

## 2021-10-15 NOTE — Assessment & Plan Note (Signed)
We discussed smoking cessation.  She is currently cutting back but not interested in using patches or gum at this time.  We did discuss considering Wellbutrin.  Given that she is already on Effexor, we will not start any new medications.  She will continue this discussion with her primary care doctor.

## 2021-10-15 NOTE — Patient Instructions (Signed)
Medication Instructions:  TAKE METOPROLOL 50 MG 1 TABLET 2 HOURS PRIOR TO CT  *If you need a refill on your cardiac medications before your next appointment, please call your pharmacy*  Lab Work: BMET SOON   If you have labs (blood work) drawn today and your tests are completely normal, you will receive your results only by: [place] (if you have MyChart) OR A paper copy in the mail If you have any lab test that is abnormal or we need to change your treatment, we will call you to review the results.  Testing/Procedures: Your physician has requested that you have cardiac CT. Cardiac computed tomography (CT) is a painless test that uses an x-ray machine to take clear, detailed pictures of your heart. For further information please visit HugeFiesta.[email or web address]. Please follow instruction sheet as given. ONCE THE OFFICE HAS BEEN REVIEWED THE OFFICE WILL CALL YOU TO SCHEDULE   Follow-Up: At [facility], you and your health needs are our priority.  As part of our continuing mission to provide you with exceptional heart care, we have created designated Provider Care Teams.  These Care Teams include your primary Cardiologist (physician) and Advanced Practice Providers (APPs -  Physician Assistants and Nurse Practitioners) who all work together to provide you with the care you need, when you need it.  We recommend signing up for the patient portal called "MyChart".  Sign up information is provided on this After Visit Summary.  MyChart is used to connect with patients for Virtual Visits (Telemedicine).  Patients are able to view lab/test results, encounter notes, upcoming appointments, etc.  Non-urgent messages can be sent to your provider as well.   To learn more about what you can do with MyChart, go to [email or web address].    Your next appointment:   12 month(s)  The format for your next appointment:   In Person  Provider:   Skeet Latch, MD   [address] [place] NP    Other Instructions  Please get a blood pressure cuff to track your blood pressure at home.  Write it down and bring to follow up.  Omron cuffs are usually pretty accurate.      Your cardiac CT will be scheduled at one of the below locations:   [facility] [address] [place], [place] [address] [phone]  [place] [address] [place], [place] [address] [phone]  If scheduled at [facility], please arrive at the [facility] and Children's Entrance (Entrance C2) of [facility] 30 minutes prior to test start time. You can use the FREE valet parking offered at entrance C (encouraged to control the heart rate for the test)  Proceed to the [facility] Radiology Department (first floor) to check-in and test prep.  All radiology patients and guests should use entrance C2 at [facility], accessed from [facility], even though the hospital's physical address listed is [address]    If scheduled at [facility], please arrive 15 mins early for check-in and test prep.  Please follow these instructions carefully (unless otherwise directed):  Hold all erectile dysfunction medications at least 3 days (72 hrs) prior to test.  On the Night Before the Test: Be sure to Drink plenty of water. Do not consume any caffeinated/decaffeinated beverages or chocolate 12 hours prior to your test. Do not take any antihistamines 12 hours prior to your test. If the patient has  contrast allergy: Patient will need a prescription for Prednisone and very clear instructions (as follows): Prednisone 50 mg - take 13 hours prior to test Take another Prednisone 50 mg 7 hours prior to test Take another Prednisone 50 mg 1 hour prior to test Take Benadryl 50 mg 1 hour prior to test Patient must complete all four doses of above prophylactic medications. Patient will  need a ride after test due to Benadryl.  On the Day of the Test: Drink plenty of water until 1 hour prior to the test. Do not eat any food 4 hours prior to the test. You may take your regular medications prior to the test.  Take metoprolol (Lopressor) two hours prior to test. HOLD Furosemide/Hydrochlorothiazide morning of the test. FEMALES- please wear underwire-free bra if available, avoid dresses & tight clothing      After the Test: Drink plenty of water. After receiving IV contrast, you may experience a mild flushed feeling. This is normal. On occasion, you may experience a mild rash up to 24 hours after the test. This is not dangerous. If this occurs, you can take Benadryl 25 mg and increase your fluid intake. If you experience trouble breathing, this can be serious. If it is severe call 911 IMMEDIATELY. If it is mild, please call our office. If you take any of these medications: Glipizide/Metformin, Avandament, Glucavance, please do not take 48 hours after completing test unless otherwise instructed.  We will call to schedule your test 2-4 weeks out understanding that some insurance companies will need an authorization prior to the service being performed.   For non-scheduling related questions, please contact the cardiac imaging nurse navigator should you have any questions/concerns: Marchia Bond, Cardiac Imaging Nurse Navigator Gordy Clement, Cardiac Imaging Nurse Navigator [facility] Direct Office Dial: [phone]   For scheduling needs, including cancellations and rescheduling, please call [place], [phone].  Cardiac CT Angiogram A cardiac CT angiogram is a procedure to look at the heart and the area around the heart. It may be done to help find the cause of chest pains or other symptoms of heart disease. During this procedure, a substance called contrast dye is injected into the blood vessels in the area to be checked. A large X-ray machine,  called a CT scanner, then takes detailed pictures of the heart and the surrounding area. The procedure is also sometimes called a coronary CT angiogram, coronary artery scanning, or CTA. A cardiac CT angiogram allows the health care provider to see how well blood is flowing to and from the heart. The health care provider will be able to see if there are any problems, such as: Blockage or narrowing of the coronary arteries in the heart. Fluid around the heart. Signs of weakness or disease in the muscles, valves, and tissues of the heart. Tell a health care provider about: Any allergies you have. This is especially important if you have had a previous allergic reaction to contrast dye. All medicines you are taking, including vitamins, herbs, eye drops, creams, and over-the-counter medicines. Any blood disorders you have. Any surgeries you have had. Any medical conditions you have. Whether you are pregnant or may be pregnant. Any anxiety disorders, chronic pain, or other conditions you have that may increase your stress or prevent you from lying still. What are the risks? Generally, this is a safe procedure. However, problems may occur, including: Bleeding. Infection. Allergic reactions to medicines or dyes. Damage to other structures or organs. Kidney damage from  the contrast dye that is used. Increased risk of cancer from radiation exposure. This risk is low. Talk with your health care provider about: The risks and benefits of testing. How you can receive the lowest dose of radiation. What happens before the procedure? Wear comfortable clothing and remove any jewelry, glasses, dentures, and hearing aids. Follow instructions from your health care provider about eating and drinking. This may include: For 12 hours before the procedure -- avoid caffeine. This includes tea, coffee, soda, energy drinks, and diet pills. Drink plenty of water or other fluids that do not have caffeine in them. Being  well hydrated can prevent complications. For 4-6 hours before the procedure -- stop eating and drinking. The contrast dye can cause nausea, but this is less likely if your stomach is empty. Ask your health care provider about changing or stopping your regular medicines. This is especially important if you are taking diabetes medicines, blood thinners, or medicines to treat problems with erections (erectile dysfunction). What happens during the procedure?  Hair on your chest may need to be removed so that small sticky patches called electrodes can be placed on your chest. These will transmit information that helps to monitor your heart during the procedure. An IV will be inserted into one of your veins. You might be given a medicine to control your heart rate during the procedure. This will help to ensure that good images are obtained. You will be asked to lie on an exam table. This table will slide in and out of the CT machine during the procedure. Contrast dye will be injected into the IV. You might feel warm, or you may get a metallic taste in your mouth. You will be given a medicine called nitroglycerin. This will relax or dilate the arteries in your heart. The table that you are lying on will move into the CT machine tunnel for the scan. The person running the machine will give you instructions while the scans are being done. You may be asked to: Keep your arms above your head. Hold your breath. Stay very still, even if the table is moving. When the scanning is complete, you will be moved out of the machine. The IV will be removed. The procedure may vary among health care providers and hospitals. What can I expect after the procedure? After your procedure, it is common to have: A metallic taste in your mouth from the contrast dye. A feeling of warmth. A headache from the nitroglycerin. Follow these instructions at home: Take over-the-counter and prescription medicines only as told by  your health care provider. If you are told, drink enough fluid to keep your urine pale yellow. This will help to flush the contrast dye out of your body. Most people can return to their normal activities right after the procedure. Ask your health care provider what activities are safe for you. It is up to you to get the results of your procedure. Ask your health care provider, or the department that is doing the procedure, when your results will be ready. Keep all follow-up visits as told by your health care provider. This is important. Contact a health care provider if: You have any symptoms of allergy to the contrast dye. These include: Shortness of breath. Rash or hives. A racing heartbeat. Summary A cardiac CT angiogram is a procedure to look at the heart and the area around the heart. It may be done to help find the cause of chest pains or other symptoms of  heart disease. During this procedure, a large X-ray machine, called a CT scanner, takes detailed pictures of the heart and the surrounding area after a contrast dye has been injected into blood vessels in the area. Ask your health care provider about changing or stopping your regular medicines before the procedure. This is especially important if you are taking diabetes medicines, blood thinners, or medicines to treat erectile dysfunction. If you are told, drink enough fluid to keep your urine pale yellow. This will help to flush the contrast dye out of your body. This information is not intended to replace advice given to you by your health care provider. Make sure you discuss any questions you have with your health care provider. Document Revised: 01/23/2021 Document Reviewed: 01/05/2019 Elsevier Patient Education  [place].

## 2021-10-15 NOTE — Assessment & Plan Note (Signed)
Symptoms are atypical. She gets chest pain/fullness when she has a panic attack.  She does also notice more shortness of breath with exertion lately.  Symptoms seem to be worse since she had COVID-19.  Overall I suspect that this is not attributable to obstructive coronary disease.  However she does have risk factors including ongoing tobacco use and poorly controlled lipids.  We will get a coronary CTA to better assess.

## 2021-10-15 NOTE — H&P (View-Only) (Signed)
Cardiology Office Note:    Date:  11/06/2021   ID:  Wendy Bryan, DOB [birth year], MRN [id number]  PCP:  Redmond School, MD   [facility] Providers Cardiologist:  None     Referring MD: Redmond School, MD   No chief complaint on file.   History of Present Illness:    Wendy Bryan is a 61 y.o. female with a hx of smoking, hypothyroidism, hyperlipidemia and COPD for evaluation of chest pain referred by Dr Gerarda Fraction. She was seen 03/23 by her PCP for shortness of breath and was treated for COPD.   Today, she reports she has a fullness in her chest that is brought on by panic attacks. It lasts for only a short time and does not cause dizziness. The panic attacks happen often because she admits she worries a lot. She notes she has not been the same since she has Covid in July 2022. Adds she has had Covid-19 three times. There has also been an increase in shortness of breath due to exertion and cough.  She had an episode of bronchitis in February 2023. Her mother was diagnosed with congestive heart failure. She has not been exercising due to the pollen.  She is trying to quit smoking and is down to 1-2 cigarettes a day.   She denies chest pain, shortness of breath, palpitations, lightheadedness, headaches, syncope, LE edema, orthopnea, PND.   Past Medical History:  Diagnosis Date   Chest pain of uncertain etiology 2/59/5638   No pertinent past medical history    Pure hypercholesterolemia 10/15/2021    Past Surgical History:  Procedure Laterality Date   ABDOMINAL HYSTERECTOMY     APPENDECTOMY     CHOLECYSTECTOMY     COLONOSCOPY  02/19/2012   Procedure: COLONOSCOPY;  Surgeon: Daneil Dolin, MD;  Location: [facility];  Service: Endoscopy;  Laterality: N/A;  9:45 AM    Current Medications: Current Meds  Medication Sig   albuterol (VENTOLIN HFA) 108 (90 Base) MCG/ACT inhaler Inhale into the lungs as needed.   ALPRAZolam (XANAX) 1 MG tablet Take 1 mg by mouth 3 (three)  times daily as needed.   aspirin EC 81 MG tablet Take 81 mg by mouth daily. Swallow whole.   cyanocobalamin (,VITAMIN B-12,) 1000 MCG/ML injection Inject into the muscle every 30 (thirty) days.   DULoxetine (CYMBALTA) 30 MG capsule Take 30 mg by mouth daily.   fluconazole (DIFLUCAN) 100 MG tablet Take 100 mg by mouth as needed.   levothyroxine (SYNTHROID) 50 MCG tablet Take 25 mcg by mouth daily.   metoprolol tartrate (LOPRESSOR) 50 MG tablet TAKE 1 TABLET 2 HOURS PRIOR TO CT     Allergies:   Demerol [meperidine]   Social History   Socioeconomic History   Marital status: Married    Spouse name: Not on file   Number of children: Not on file   Years of education: Not on file   Highest education level: Not on file  Occupational History   Not on file  Tobacco Use   Smoking status: Every Day    Packs/day: 1.00    Years: 30.00    Total pack years: 30.00    Types: Cigarettes   Smokeless tobacco: Never  Substance and Sexual Activity   Alcohol use: Yes    Alcohol/week: 0.0 standard drinks of alcohol    Comment: occsional beer and wine   Drug use: No   Sexual activity: Not on file  Other Topics Concern   Not  on file  Social History Narrative   Lives with husband and son in a one story home.  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  Education: high school, some college.   Social Determinants of Health   Financial Resource Strain: Not on file  Food Insecurity: Not on file  Transportation Needs: Not on file  Physical Activity: Not on file  Stress: Not on file  Social Connections: Not on file     Family History: The patient's family history includes Heart disease in her mother; Heart failure in her mother; Hyperlipidemia in her father; Hypertension in her father; Thyroid disease in her father. There is no history of Colon cancer.  ROS:   Please see the history of present illness.    (+) cough (+) shortness of breath  All other systems reviewed and are negative.  EKGs/Labs/Other  Studies Reviewed:    The following studies were reviewed today: none  EKG:  EKG is  ordered today.  The ekg ordered today demonstrates sinus rhythm rate- 73 bpm  Recent Labs: 10/31/2021: BUN 10; Creatinine, Ser 0.71; Potassium 4.0; Sodium 141    02/2021: Total Cholesterol - 235 Triglycerides - 91 HDL -69 LDL -152 TSH - 1.3   Recent Lipid Panel No results found for: "CHOL", "TRIG", "HDL", "CHOLHDL", "VLDL", "LDLCALC", "LDLDIRECT"   Physical Exam:    VS:  BP 136/84 (BP Location: Right Arm, Patient Position: Sitting)   Pulse 73   Ht 5' (1.524 m)   Wt 105 lb 9.6 oz (47.9 kg)   BMI 20.62 kg/m     Wt Readings from Last 3 Encounters:  10/15/21 105 lb 9.6 oz (47.9 kg)  05/31/18 98 lb (44.5 kg)  05/15/18 103 lb (46.7 kg)     GENERAL:  Well appearing HEENT:  Pupils equal round and reactive, fundi not visualized, oral mucosa unremarkable NECK:  No jugular venous distention, waveform within normal limits, carotid upstroke brisk and symmetric, no bruits LUNGS:  Clear to auscultation bilaterally. No crackles, rhonchi or wheezes HEART:  RRR.  PMI not displaced or sustained,S1 and S2 within normal limits, no S3, no S4, no clicks, no rubs, no murmurs ABD:  Flat, positive bowel sounds normal in frequency in pitch, no bruits, no rebound, no guarding, no midline pulsatile mass, no hepatomegaly, no splenomegaly EXT:  2 plus pulses throughout, no edema. No cyanosis, no clubbing SKIN:  No rashes no nodules NEURO:  Cranial nerves II through XII grossly intact, motor grossly intact throughout PSYCH:  Cognitively intact, oriented to person place and time  ASSESSMENT:    1. Chest pain of uncertain etiology   2. Tobacco dependence   3. Pure hypercholesterolemia    PLAN:    In order of problems listed above:  Chest pain of uncertain etiology Symptoms are atypical. She gets chest pain/fullness when she has a panic attack.  She does also notice more shortness of breath with exertion lately.   Symptoms seem to be worse since she had COVID-19.  Overall I suspect that this is not attributable to obstructive coronary disease.  However she does have risk factors including ongoing tobacco use and poorly controlled lipids.  We will get a coronary CTA to better assess.  Tobacco dependence We discussed smoking cessation.  She is currently cutting back but not interested in using patches or gum at this time.  We did discuss considering Wellbutrin.  Given that she is already on Effexor, we will not start any new medications.  She will continue this  discussion with her primary care doctor.  Pure hypercholesterolemia Lipids are poorly controlled as above.  We are getting a coronary CTA.  Based on these results she may need to start a statin.  Advised smoking cessation as above.    Follow-up with [facility] in 2 months and Skeet Latch, MD in 1 year.  Medication Adjustments/Labs and Tests Ordered: Current medicines are reviewed at length with the patient today.  Concerns regarding medicines are outlined above.  Orders Placed This Encounter  Procedures   CT CORONARY MORPH W/CTA COR W/SCORE W/CA W/CM &/OR WO/CM   Basic metabolic panel   EKG 29-BMWU   Meds ordered this encounter  Medications   metoprolol tartrate (LOPRESSOR) 50 MG tablet    Sig: TAKE 1 TABLET 2 HOURS PRIOR TO CT    Dispense:  1 tablet    Refill:  0    Patient Instructions  Medication Instructions:  TAKE METOPROLOL 50 MG 1 TABLET 2 HOURS PRIOR TO CT  *If you need a refill on your cardiac medications before your next appointment, please call your pharmacy*  Lab Work: BMET SOON   If you have labs (blood work) drawn today and your tests are completely normal, you will receive your results only by: [employer] (if you have MyChart) OR A paper copy in the mail If you have any lab test that is abnormal or we need to change your treatment, we will call you to review the results.  Testing/Procedures: Your physician has  requested that you have cardiac CT. Cardiac computed tomography (CT) is a painless test that uses an x-ray machine to take clear, detailed pictures of your heart. For further information please visit HugeFiesta.tn. Please follow instruction sheet as given. ONCE THE OFFICE HAS BEEN REVIEWED THE OFFICE WILL CALL YOU TO SCHEDULE   Follow-Up: At [facility], you and your health needs are our priority.  As part of our continuing mission to provide you with exceptional heart care, we have created designated Provider Care Teams.  These Care Teams include your primary Cardiologist (physician) and Advanced Practice Providers (APPs -  Physician Assistants and Nurse Practitioners) who all work together to provide you with the care you need, when you need it.  We recommend signing up for the patient portal called "MyChart".  Sign up information is provided on this After Visit Summary.  MyChart is used to connect with patients for Virtual Visits (Telemedicine).  Patients are able to view lab/test results, encounter notes, upcoming appointments, etc.  Non-urgent messages can be sent to your provider as well.   To learn more about what you can do with MyChart, go to [email or web address].    Your next appointment:   12 month(s)  The format for your next appointment:   In Person  Provider:   Skeet Latch, MD   2 [place] NP   Other Instructions  Please get a blood pressure cuff to track your blood pressure at home.  Write it down and bring to follow up.  Omron cuffs are usually pretty accurate.      Your cardiac CT will be scheduled at one of the below locations:   [facility] [address] [place], [place] [address] [phone] [place] [address] [place], [place] [address] [phone]  If scheduled at [facility], please arrive at the [facility] and Children's Entrance (Entrance C2) of  [facility] 30 minutes prior to test  start time. You can use the FREE valet parking offered at entrance C (encouraged to control the heart rate for the test)  Proceed to the [facility] Radiology Department (first floor) to check-in and test prep.  All radiology patients and guests should use entrance C2 at [facility], accessed from [facility], even though the hospital's physical address listed is [address].    If scheduled at [facility], please arrive 15 mins early for check-in and test prep.  Please follow these instructions carefully (unless otherwise directed):  Hold all erectile dysfunction medications at least 3 days (72 hrs) prior to test.  On the Night Before the Test: Be sure to Drink plenty of water. Do not consume any caffeinated/decaffeinated beverages or chocolate 12 hours prior to your test. Do not take any antihistamines 12 hours prior to your test. If the patient has contrast allergy: Patient will need a prescription for Prednisone and very clear instructions (as follows): Prednisone 50 mg - take 13 hours prior to test Take another Prednisone 50 mg 7 hours prior to test Take another Prednisone 50 mg 1 hour prior to test Take Benadryl 50 mg 1 hour prior to test Patient must complete all four doses of above prophylactic medications. Patient will need a ride after test due to Benadryl.  On the Day of the Test: Drink plenty of water until 1 hour prior to the test. Do not eat any food 4 hours prior to the test. You may take your regular medications prior to the test.  Take metoprolol (Lopressor) two hours prior to test. HOLD Furosemide/Hydrochlorothiazide morning of the test. FEMALES- please wear underwire-free bra if available, avoid dresses & tight clothing      After the Test: Drink plenty of water. After receiving IV contrast, you may experience a mild flushed feeling. This is normal. On  occasion, you may experience a mild rash up to 24 hours after the test. This is not dangerous. If this occurs, you can take Benadryl 25 mg and increase your fluid intake. If you experience trouble breathing, this can be serious. If it is severe call 911 IMMEDIATELY. If it is mild, please call our office. If you take any of these medications: Glipizide/Metformin, Avandament, Glucavance, please do not take 48 hours after completing test unless otherwise instructed.  We will call to schedule your test 2-4 weeks out understanding that some insurance companies will need an authorization prior to the service being performed.   For non-scheduling related questions, please contact the cardiac imaging nurse navigator should you have any questions/concerns: Marchia Bond, Cardiac Imaging Nurse Navigator Gordy Clement, Cardiac Imaging Nurse Navigator [facility] Direct Office Dial: [phone]   For scheduling needs, including cancellations and rescheduling, please call [place], [phone].  Cardiac CT Angiogram A cardiac CT angiogram is a procedure to look at the heart and the area around the heart. It may be done to help find the cause of chest pains or other symptoms of heart disease. During this procedure, a substance called contrast dye is injected into the blood vessels in the area to be checked. A large X-ray machine, called a CT scanner, then takes detailed pictures of the heart and the surrounding area. The procedure is also sometimes called a coronary CT angiogram, coronary artery scanning, or CTA. A cardiac CT angiogram allows the health care provider to see how well blood is flowing to and from the heart. The health care provider will  be able to see if there are any problems, such as: Blockage or narrowing of the coronary arteries in the heart. Fluid around the heart. Signs of weakness or disease in the muscles, valves, and tissues of the heart. Tell a health care  provider about: Any allergies you have. This is especially important if you have had a previous allergic reaction to contrast dye. All medicines you are taking, including vitamins, herbs, eye drops, creams, and over-the-counter medicines. Any blood disorders you have. Any surgeries you have had. Any medical conditions you have. Whether you are pregnant or may be pregnant. Any anxiety disorders, chronic pain, or other conditions you have that may increase your stress or prevent you from lying still. What are the risks? Generally, this is a safe procedure. However, problems may occur, including: Bleeding. Infection. Allergic reactions to medicines or dyes. Damage to other structures or organs. Kidney damage from the contrast dye that is used. Increased risk of cancer from radiation exposure. This risk is low. Talk with your health care provider about: The risks and benefits of testing. How you can receive the lowest dose of radiation. What happens before the procedure? Wear comfortable clothing and remove any jewelry, glasses, dentures, and hearing aids. Follow instructions from your health care provider about eating and drinking. This may include: For 12 hours before the procedure -- avoid caffeine. This includes tea, coffee, soda, energy drinks, and diet pills. Drink plenty of water or other fluids that do not have caffeine in them. Being well hydrated can prevent complications. For 4-6 hours before the procedure -- stop eating and drinking. The contrast dye can cause nausea, but this is less likely if your stomach is empty. Ask your health care provider about changing or stopping your regular medicines. This is especially important if you are taking diabetes medicines, blood thinners, or medicines to treat problems with erections (erectile dysfunction). What happens during the procedure?  Hair on your chest may need to be removed so that small sticky patches called electrodes can be placed  on your chest. These will transmit information that helps to monitor your heart during the procedure. An IV will be inserted into one of your veins. You might be given a medicine to control your heart rate during the procedure. This will help to ensure that good images are obtained. You will be asked to lie on an exam table. This table will slide in and out of the CT machine during the procedure. Contrast dye will be injected into the IV. You might feel warm, or you may get a metallic taste in your mouth. You will be given a medicine called nitroglycerin. This will relax or dilate the arteries in your heart. The table that you are lying on will move into the CT machine tunnel for the scan. The person running the machine will give you instructions while the scans are being done. You may be asked to: Keep your arms above your head. Hold your breath. Stay very still, even if the table is moving. When the scanning is complete, you will be moved out of the machine. The IV will be removed. The procedure may vary among health care providers and hospitals. What can I expect after the procedure? After your procedure, it is common to have: A metallic taste in your mouth from the contrast dye. A feeling of warmth. A headache from the nitroglycerin. Follow these instructions at home: Take over-the-counter and prescription medicines only as told by your health care provider. If  you are told, drink enough fluid to keep your urine pale yellow. This will help to flush the contrast dye out of your body. Most people can return to their normal activities right after the procedure. Ask your health care provider what activities are safe for you. It is up to you to get the results of your procedure. Ask your health care provider, or the department that is doing the procedure, when your results will be ready. Keep all follow-up visits as told by your health care provider. This is important. Contact a health care  provider if: You have any symptoms of allergy to the contrast dye. These include: Shortness of breath. Rash or hives. A racing heartbeat. Summary A cardiac CT angiogram is a procedure to look at the heart and the area around the heart. It may be done to help find the cause of chest pains or other symptoms of heart disease. During this procedure, a large X-ray machine, called a CT scanner, takes detailed pictures of the heart and the surrounding area after a contrast dye has been injected into blood vessels in the area. Ask your health care provider about changing or stopping your regular medicines before the procedure. This is especially important if you are taking diabetes medicines, blood thinners, or medicines to treat erectile dysfunction. If you are told, drink enough fluid to keep your urine pale yellow. This will help to flush the contrast dye out of your body. This information is not intended to replace advice given to you by your health care provider. Make sure you discuss any questions you have with your health care provider. Document Revised: 01/23/2021 Document Reviewed: 01/05/2019 Elsevier Patient Education  [place]ham [place]oli,acting as a Education administrator for Skeet Latch, MD.,have documented all relevant documentation on the behalf of Skeet Latch, MD,as directed by  Skeet Latch, MD while in the presence of Skeet Latch, MD.   I, [place] Oval Linsey, MD have reviewed all documentation for this visit.  The documentation of the exam, diagnosis, procedures, and orders on 11/06/2021 are all accurate and complete.   Signed, Skeet Latch, MD  11/06/2021 9:01 AM    [place]

## 2021-10-15 NOTE — Progress Notes (Signed)
Cardiology Office Note:    Date:  11/06/2021   ID:  Wendy Bryan, DOB 1960-10-17, MRN [id number]  PCP:  Redmond School, MD   [facility] Providers Cardiologist:  None     Referring MD: Redmond School, MD   No chief complaint on file.   History of Present Illness:    Wendy Bryan is a 61 y.o. female with a hx of smoking, hypothyroidism, hyperlipidemia and COPD for evaluation of chest pain referred by Dr Gerarda Fraction. She was seen 03/23 by her PCP for shortness of breath and was treated for COPD.   Today, she reports she has a fullness in her chest that is brought on by panic attacks. It lasts for only a short time and does not cause dizziness. The panic attacks happen often because she admits she worries a lot. She notes she has not been the same since she has Covid in July 2022. Adds she has had Covid-19 three times. There has also been an increase in shortness of breath due to exertion and cough.  She had an episode of bronchitis in February 2023. Her mother was diagnosed with congestive heart failure. She has not been exercising due to the pollen.  She is trying to quit smoking and is down to 1-2 cigarettes a day.   She denies chest pain, shortness of breath, palpitations, lightheadedness, headaches, syncope, LE edema, orthopnea, PND.   Past Medical History:  Diagnosis Date   Chest pain of uncertain etiology 6/83/4196   No pertinent past medical history    Pure hypercholesterolemia 10/15/2021    Past Surgical History:  Procedure Laterality Date   ABDOMINAL HYSTERECTOMY     APPENDECTOMY     CHOLECYSTECTOMY     COLONOSCOPY  02/19/2012   Procedure: COLONOSCOPY;  Surgeon: Daneil Dolin, MD;  Location: [facility];  Service: Endoscopy;  Laterality: N/A;  9:45 AM    Current Medications: Current Meds  Medication Sig   albuterol (VENTOLIN HFA) 108 (90 Base) MCG/ACT inhaler Inhale into the lungs as needed.   ALPRAZolam (XANAX) 1 MG tablet Take 1 mg by mouth 3 (three)  times daily as needed.   aspirin EC 81 MG tablet Take 81 mg by mouth daily. Swallow whole.   cyanocobalamin (,VITAMIN B-12,) 1000 MCG/ML injection Inject into the muscle every 30 (thirty) days.   DULoxetine (CYMBALTA) 30 MG capsule Take 30 mg by mouth daily.   fluconazole (DIFLUCAN) 100 MG tablet Take 100 mg by mouth as needed.   levothyroxine (SYNTHROID) 50 MCG tablet Take 25 mcg by mouth daily.   metoprolol tartrate (LOPRESSOR) 50 MG tablet TAKE 1 TABLET 2 HOURS PRIOR TO CT     Allergies:   Demerol [meperidine]   Social History   Socioeconomic History   Marital status: Married    Spouse name: Not on file   Number of children: Not on file   Years of education: Not on file   Highest education level: Not on file  Occupational History   Not on file  Tobacco Use   Smoking status: Every Day    Packs/day: 1.00    Years: 30.00    Total pack years: 30.00    Types: Cigarettes   Smokeless tobacco: Never  Substance and Sexual Activity   Alcohol use: Yes    Alcohol/week: 0.0 standard drinks of alcohol    Comment: occsional beer and wine   Drug use: No   Sexual activity: Not on file  Other Topics Concern   Not  on file  Social History Narrative   Lives with husband and son in a one story home.  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  Education: high school, some college.   Social Determinants of Health   Financial Resource Strain: Not on file  Food Insecurity: Not on file  Transportation Needs: Not on file  Physical Activity: Not on file  Stress: Not on file  Social Connections: Not on file     Family History: The patient's family history includes Heart disease in her mother; Heart failure in her mother; Hyperlipidemia in her father; Hypertension in her father; Thyroid disease in her father. There is no history of Colon cancer.  ROS:   Please see the history of present illness.    (+) cough (+) shortness of breath  All other systems reviewed and are negative.  EKGs/Labs/Other  Studies Reviewed:    The following studies were reviewed today: none  EKG:  EKG is  ordered today.  The ekg ordered today demonstrates sinus rhythm rate- 73 bpm  Recent Labs: 10/31/2021: BUN 10; Creatinine, Ser 0.71; Potassium 4.0; Sodium 141    02/2021: Total Cholesterol - 235 Triglycerides - 91 HDL -69 LDL -152 TSH - 1.3   Recent Lipid Panel No results found for: "CHOL", "TRIG", "HDL", "CHOLHDL", "VLDL", "LDLCALC", "LDLDIRECT"   Physical Exam:    VS:  BP 136/84 (BP Location: Right Arm, Patient Position: Sitting)   Pulse 73   Ht 5' (1.524 m)   Wt 105 lb 9.6 oz (47.9 kg)   BMI 20.62 kg/m     Wt Readings from Last 3 Encounters:  10/15/21 105 lb 9.6 oz (47.9 kg)  05/31/18 98 lb (44.5 kg)  05/15/18 103 lb (46.7 kg)     GENERAL:  Well appearing HEENT:  Pupils equal round and reactive, fundi not visualized, oral mucosa unremarkable NECK:  No jugular venous distention, waveform within normal limits, carotid upstroke brisk and symmetric, no bruits LUNGS:  Clear to auscultation bilaterally. No crackles, rhonchi or wheezes HEART:  RRR.  PMI not displaced or sustained,S1 and S2 within normal limits, no S3, no S4, no clicks, no rubs, no murmurs ABD:  Flat, positive bowel sounds normal in frequency in pitch, no bruits, no rebound, no guarding, no midline pulsatile mass, no hepatomegaly, no splenomegaly EXT:  2 plus pulses throughout, no edema. No cyanosis, no clubbing SKIN:  No rashes no nodules NEURO:  Cranial nerves II through XII grossly intact, motor grossly intact throughout PSYCH:  Cognitively intact, oriented to person place and time  ASSESSMENT:    1. Chest pain of uncertain etiology   2. Tobacco dependence   3. Pure hypercholesterolemia    PLAN:    In order of problems listed above:  Chest pain of uncertain etiology Symptoms are atypical. She gets chest pain/fullness when she has a panic attack.  She does also notice more shortness of breath with exertion lately.   Symptoms seem to be worse since she had COVID-19.  Overall I suspect that this is not attributable to obstructive coronary disease.  However she does have risk factors including ongoing tobacco use and poorly controlled lipids.  We will get a coronary CTA to better assess.  Tobacco dependence We discussed smoking cessation.  She is currently cutting back but not interested in using patches or gum at this time.  We did discuss considering Wellbutrin.  Given that she is already on Effexor, we will not start any new medications.  She will continue this  discussion with her primary care doctor.  Pure hypercholesterolemia Lipids are poorly controlled as above.  We are getting a coronary CTA.  Based on these results she may need to start a statin.  Advised smoking cessation as above.    Follow-up with [facility] in 2 months and Skeet Latch, MD in 1 year.  Medication Adjustments/Labs and Tests Ordered: Current medicines are reviewed at length with the patient today.  Concerns regarding medicines are outlined above.  Orders Placed This Encounter  Procedures   CT CORONARY MORPH W/CTA COR W/SCORE W/CA W/CM &/OR WO/CM   Basic metabolic panel   EKG 75-IEPP   Meds ordered this encounter  Medications   metoprolol tartrate (LOPRESSOR) 50 MG tablet    Sig: TAKE 1 TABLET 2 HOURS PRIOR TO CT    Dispense:  1 tablet    Refill:  0    Patient Instructions  Medication Instructions:  TAKE METOPROLOL 50 MG 1 TABLET 2 HOURS PRIOR TO CT  *If you need a refill on your cardiac medications before your next appointment, please call your pharmacy*  Lab Work: BMET SOON   If you have labs (blood work) drawn today and your tests are completely normal, you will receive your results only by: [employer] (if you have MyChart) OR A paper copy in the mail If you have any lab test that is abnormal or we need to change your treatment, we will call you to review the results.  Testing/Procedures: Your physician has  requested that you have cardiac CT. Cardiac computed tomography (CT) is a painless test that uses an x-ray machine to take clear, detailed pictures of your heart. For further information please visit HugeFiesta.tn. Please follow instruction sheet as given. ONCE THE OFFICE HAS BEEN REVIEWED THE OFFICE WILL CALL YOU TO SCHEDULE   Follow-Up: At [facility], you and your health needs are our priority.  As part of our continuing mission to provide you with exceptional heart care, we have created designated Provider Care Teams.  These Care Teams include your primary Cardiologist (physician) and Advanced Practice Providers (APPs -  Physician Assistants and Nurse Practitioners) who all work together to provide you with the care you need, when you need it.  We recommend signing up for the patient portal called "MyChart".  Sign up information is provided on this After Visit Summary.  MyChart is used to connect with patients for Virtual Visits (Telemedicine).  Patients are able to view lab/test results, encounter notes, upcoming appointments, etc.  Non-urgent messages can be sent to your provider as well.   To learn more about what you can do with MyChart, go to [email or web address].    Your next appointment:   12 month(s)  The format for your next appointment:   In Person  Provider:   Skeet Latch, MD   2 [place] NP   Other Instructions  Please get a blood pressure cuff to track your blood pressure at home.  Write it down and bring to follow up.  Omron cuffs are usually pretty accurate.      Your cardiac CT will be scheduled at one of the below locations:   [facility] [address] [place], [place] [address] (336) [place] [address] [place], [place] [address] [phone]  If scheduled at [facility], please arrive at the [facility] and Children's Entrance (Entrance C2) of  [facility] 30 minutes prior to test  start time. You can use the FREE valet parking offered at entrance C (encouraged to control the heart rate for the test)  Proceed to the [facility] Radiology Department (first floor) to check-in and test prep.  All radiology patients and guests should use entrance C2 at [facility], accessed from [facility], even though the hospital's physical address listed is [address]    If scheduled at [facility], please arrive 15 mins early for check-in and test prep.  Please follow these instructions carefully (unless otherwise directed):  Hold all erectile dysfunction medications at least 3 days (72 hrs) prior to test.  On the Night Before the Test: Be sure to Drink plenty of water. Do not consume any caffeinated/decaffeinated beverages or chocolate 12 hours prior to your test. Do not take any antihistamines 12 hours prior to your test. If the patient has contrast allergy: Patient will need a prescription for Prednisone and very clear instructions (as follows): Prednisone 50 mg - take 13 hours prior to test Take another Prednisone 50 mg 7 hours prior to test Take another Prednisone 50 mg 1 hour prior to test Take Benadryl 50 mg 1 hour prior to test Patient must complete all four doses of above prophylactic medications. Patient will need a ride after test due to Benadryl.  On the Day of the Test: Drink plenty of water until 1 hour prior to the test. Do not eat any food 4 hours prior to the test. You may take your regular medications prior to the test.  Take metoprolol (Lopressor) two hours prior to test. HOLD Furosemide/Hydrochlorothiazide morning of the test. FEMALES- please wear underwire-free bra if available, avoid dresses & tight clothing      After the Test: Drink plenty of water. After receiving IV contrast, you may experience a mild flushed feeling. This is normal. On  occasion, you may experience a mild rash up to 24 hours after the test. This is not dangerous. If this occurs, you can take Benadryl 25 mg and increase your fluid intake. If you experience trouble breathing, this can be serious. If it is severe call 911 IMMEDIATELY. If it is mild, please call our office. If you take any of these medications: Glipizide/Metformin, Avandament, Glucavance, please do not take 48 hours after completing test unless otherwise instructed.  We will call to schedule your test 2-4 weeks out understanding that some insurance companies will need an authorization prior to the service being performed.   For non-scheduling related questions, please contact the cardiac imaging nurse navigator should you have any questions/concerns: Marchia Bond, Cardiac Imaging Nurse Navigator Gordy Clement, Cardiac Imaging Nurse Navigator [facility] Direct Office Dial: [phone]   For scheduling needs, including cancellations and rescheduling, please call [place], [phone].  Cardiac CT Angiogram A cardiac CT angiogram is a procedure to look at the heart and the area around the heart. It may be done to help find the cause of chest pains or other symptoms of heart disease. During this procedure, a substance called contrast dye is injected into the blood vessels in the area to be checked. A large X-ray machine, called a CT scanner, then takes detailed pictures of the heart and the surrounding area. The procedure is also sometimes called a coronary CT angiogram, coronary artery scanning, or CTA. A cardiac CT angiogram allows the health care provider to see how well blood is flowing to and from the heart. The health care provider will  be able to see if there are any problems, such as: Blockage or narrowing of the coronary arteries in the heart. Fluid around the heart. Signs of weakness or disease in the muscles, valves, and tissues of the heart. Tell a health care  provider about: Any allergies you have. This is especially important if you have had a previous allergic reaction to contrast dye. All medicines you are taking, including vitamins, herbs, eye drops, creams, and over-the-counter medicines. Any blood disorders you have. Any surgeries you have had. Any medical conditions you have. Whether you are pregnant or may be pregnant. Any anxiety disorders, chronic pain, or other conditions you have that may increase your stress or prevent you from lying still. What are the risks? Generally, this is a safe procedure. However, problems may occur, including: Bleeding. Infection. Allergic reactions to medicines or dyes. Damage to other structures or organs. Kidney damage from the contrast dye that is used. Increased risk of cancer from radiation exposure. This risk is low. Talk with your health care provider about: The risks and benefits of testing. How you can receive the lowest dose of radiation. What happens before the procedure? Wear comfortable clothing and remove any jewelry, glasses, dentures, and hearing aids. Follow instructions from your health care provider about eating and drinking. This may include: For 12 hours before the procedure -- avoid caffeine. This includes tea, coffee, soda, energy drinks, and diet pills. Drink plenty of water or other fluids that do not have caffeine in them. Being well hydrated can prevent complications. For 4-6 hours before the procedure -- stop eating and drinking. The contrast dye can cause nausea, but this is less likely if your stomach is empty. Ask your health care provider about changing or stopping your regular medicines. This is especially important if you are taking diabetes medicines, blood thinners, or medicines to treat problems with erections (erectile dysfunction). What happens during the procedure?  Hair on your chest may need to be removed so that small sticky patches called electrodes can be placed  on your chest. These will transmit information that helps to monitor your heart during the procedure. An IV will be inserted into one of your veins. You might be given a medicine to control your heart rate during the procedure. This will help to ensure that good images are obtained. You will be asked to lie on an exam table. This table will slide in and out of the CT machine during the procedure. Contrast dye will be injected into the IV. You might feel warm, or you may get a metallic taste in your mouth. You will be given a medicine called nitroglycerin. This will relax or dilate the arteries in your heart. The table that you are lying on will move into the CT machine tunnel for the scan. The person running the machine will give you instructions while the scans are being done. You may be asked to: Keep your arms above your head. Hold your breath. Stay very still, even if the table is moving. When the scanning is complete, you will be moved out of the machine. The IV will be removed. The procedure may vary among health care providers and hospitals. What can I expect after the procedure? After your procedure, it is common to have: A metallic taste in your mouth from the contrast dye. A feeling of warmth. A headache from the nitroglycerin. Follow these instructions at home: Take over-the-counter and prescription medicines only as told by your health care provider. If  you are told, drink enough fluid to keep your urine pale yellow. This will help to flush the contrast dye out of your body. Most people can return to their normal activities right after the procedure. Ask your health care provider what activities are safe for you. It is up to you to get the results of your procedure. Ask your health care provider, or the department that is doing the procedure, when your results will be ready. Keep all follow-up visits as told by your health care provider. This is important. Contact a health care  provider if: You have any symptoms of allergy to the contrast dye. These include: Shortness of breath. Rash or hives. A racing heartbeat. Summary A cardiac CT angiogram is a procedure to look at the heart and the area around the heart. It may be done to help find the cause of chest pains or other symptoms of heart disease. During this procedure, a large X-ray machine, called a CT scanner, takes detailed pictures of the heart and the surrounding area after a contrast dye has been injected into blood vessels in the area. Ask your health care provider about changing or stopping your regular medicines before the procedure. This is especially important if you are taking diabetes medicines, blood thinners, or medicines to treat erectile dysfunction. If you are told, drink enough fluid to keep your urine pale yellow. This will help to flush the contrast dye out of your body. This information is not intended to replace advice given to you by your health care provider. Make sure you discuss any questions you have with your health care provider. Document Revised: 01/23/2021 Document Reviewed: 01/05/2019 Elsevier Patient Education  Warrenton Okoli,acting as a Education administrator for Skeet Latch, MD.,have documented all relevant documentation on the behalf of Skeet Latch, MD,as directed by  Skeet Latch, MD while in the presence of Skeet Latch, MD.   I, Ch[place]ot Oval Linsey, MD have reviewed all documentation for this visit.  The documentation of the exam, diagnosis, procedures, and orders on 11/06/2021 are all accurate and complete.   Signed, Skeet Latch, MD  11/06/2021 9:01 AM    [place]

## 2021-10-15 NOTE — Assessment & Plan Note (Signed)
Lipids are poorly controlled as above.  We are getting a coronary CTA.  Based on these results she may need to start a statin.  Advised smoking cessation as above.

## 2021-10-16 ENCOUNTER — Telehealth: Payer: Self-pay | Admitting: Cardiovascular Disease

## 2021-10-16 ENCOUNTER — Encounter (HOSPITAL_BASED_OUTPATIENT_CLINIC_OR_DEPARTMENT_OTHER): Payer: Self-pay

## 2021-10-16 NOTE — Telephone Encounter (Signed)
Called and spoke with patient and she states she has everything straightened out---she found out that [facility] is in her insurance network.

## 2021-10-16 NOTE — Telephone Encounter (Signed)
RN returned call to patient to verify whether or not she needed to take benadryl or prednisone before her CT, she does not.    Patient wants to make sure that she is not out of network with her insurance. She states she called the finance line and they told her that they could not see her insurance card in the chart. RN verified that her insurance card is in the chart.   Unsure who to direct patient to, will route to Satira Sark for advice

## 2021-10-16 NOTE — Telephone Encounter (Signed)
Patient is calling requesting a callback regarding the instructions listed for her CT. She states it advises her to take a prednisone and benadryl on her AVS that was not dicussed at her appt. Please advise.

## 2021-11-01 ENCOUNTER — Telehealth: Payer: Self-pay | Admitting: Cardiovascular Disease

## 2021-11-01 LAB — BASIC METABOLIC PANEL
BUN/Creatinine Ratio: 14 (ref 12–28)
BUN: 10 mg/dL (ref 8–27)
CO2: 23 mmol/L (ref 20–29)
Calcium: 10 mg/dL (ref 8.7–10.3)
Chloride: 102 mmol/L (ref 96–106)
Creatinine, Ser: 0.71 mg/dL (ref 0.57–1.00)
Glucose: 92 mg/dL (ref 70–99)
Potassium: 4 mmol/L (ref 3.5–5.2)
Sodium: 141 mmol/L (ref 134–144)
eGFR: 97 mL/min/{1.73_m2} (ref >59)

## 2021-11-01 NOTE — Telephone Encounter (Signed)
Pt states that she had her blood work done and nobody has looked over her blood work to confirm that she's still good to go for her upcoming CT. Please advise.

## 2021-11-04 ENCOUNTER — Telehealth (HOSPITAL_COMMUNITY): Payer: Self-pay | Admitting: *Deleted

## 2021-11-04 NOTE — Telephone Encounter (Signed)
Patient returning call regarding upcoming cardiac imaging study; pt verbalizes understanding of appt date/time, parking situation and where to check in, pre-test NPO status and medications ordered, and verified current allergies; name and call back number provided for further questions should they arise  Gordy Clement RN Navigator Cardiac Imaging [facility] [phone] office [phone] cell  Patient to take '50mg'$  metoprolol two hours prior to her cardiac CT scan.  She is aware to arrive at 2pm. Her husband will be coming with her for the test.

## 2021-11-04 NOTE — Telephone Encounter (Signed)
Attempted to call patient regarding upcoming cardiac CT appointment. °Left message on voicemail with name and callback number ° °Johnny Latu RN Navigator Cardiac Imaging °[facility] °[phone] Office °[phone] Cell ° °

## 2021-11-04 NOTE — Telephone Encounter (Signed)
Dr. Oval Linsey not here, patient anxious about results, can you result for me?

## 2021-11-05 ENCOUNTER — Ambulatory Visit (HOSPITAL_COMMUNITY)
Admission: RE | Admit: 2021-11-05 | Discharge: 2021-11-05 | Disposition: A | Discharge status: Home / Self Care | Payer: No Typology Code available for payment source | Source: Ambulatory Visit | Attending: Cardiovascular Disease | Admitting: Cardiovascular Disease

## 2021-11-05 DIAGNOSIS — F17200 Nicotine dependence, unspecified, uncomplicated: Secondary | ICD-10-CM | POA: Insufficient documentation

## 2021-11-05 DIAGNOSIS — E7800 Pure hypercholesterolemia, unspecified: Secondary | ICD-10-CM | POA: Insufficient documentation

## 2021-11-05 DIAGNOSIS — R931 Abnormal findings on diagnostic imaging of heart and coronary circulation: Secondary | ICD-10-CM | POA: Diagnosis present

## 2021-11-05 DIAGNOSIS — R079 Chest pain, unspecified: Secondary | ICD-10-CM | POA: Insufficient documentation

## 2021-11-05 DIAGNOSIS — I2510 Atherosclerotic heart disease of native coronary artery without angina pectoris: Secondary | ICD-10-CM

## 2021-11-05 MED ORDER — NITROGLYCERIN 0.4 MG SL SUBL
0.8000 mg | SUBLINGUAL_TABLET | Freq: Once | SUBLINGUAL | Status: AC
  Administered 2021-11-05: 0.8 mg via SUBLINGUAL

## 2021-11-05 MED ORDER — IOHEXOL 350 MG/ML SOLN
100.0000 mL | Freq: Once | INTRAVENOUS | Status: AC | PRN
  Administered 2021-11-05: 100 mL via INTRAVENOUS

## 2021-11-05 MED ORDER — NITROGLYCERIN 0.4 MG SL SUBL
SUBLINGUAL_TABLET | SUBLINGUAL | Status: AC
  Filled 2021-11-05: qty 2

## 2021-11-06 ENCOUNTER — Ambulatory Visit (HOSPITAL_COMMUNITY)
Admission: RE | Admit: 2021-11-06 | Discharge: 2021-11-06 | Disposition: A | Discharge status: Home / Self Care | Payer: No Typology Code available for payment source | Source: Ambulatory Visit | Attending: Cardiology | Admitting: Cardiology

## 2021-11-06 ENCOUNTER — Telehealth: Payer: Self-pay | Admitting: Cardiovascular Disease

## 2021-11-06 ENCOUNTER — Other Ambulatory Visit: Payer: Self-pay | Admitting: Cardiology

## 2021-11-06 DIAGNOSIS — R931 Abnormal findings on diagnostic imaging of heart and coronary circulation: Secondary | ICD-10-CM

## 2021-11-06 NOTE — Telephone Encounter (Signed)
Spoke with patient and advised CT results are not completed yet, waiting on FFR

## 2021-11-06 NOTE — Telephone Encounter (Signed)
Pt calling to f/u on test results. Pt would like a call back once results are released to be explained to her. Please advise

## 2021-11-07 ENCOUNTER — Encounter (HOSPITAL_BASED_OUTPATIENT_CLINIC_OR_DEPARTMENT_OTHER): Payer: Self-pay | Admitting: *Deleted

## 2021-11-07 ENCOUNTER — Ambulatory Visit (HOSPITAL_COMMUNITY)
Admission: RE | Admit: 2021-11-07 | Discharge: 2021-11-07 | Disposition: A | Discharge status: Home / Self Care | Payer: No Typology Code available for payment source | Source: Ambulatory Visit | Attending: Cardiology | Admitting: Cardiology

## 2021-11-07 DIAGNOSIS — R931 Abnormal findings on diagnostic imaging of heart and coronary circulation: Secondary | ICD-10-CM | POA: Diagnosis not present

## 2021-11-07 DIAGNOSIS — Z01812 Encounter for preprocedural laboratory examination: Secondary | ICD-10-CM

## 2021-11-07 NOTE — Telephone Encounter (Signed)
Called and discussed cath results with patient.  Will plan for cath  next week with Dr. Burt Knack.   Risks and benefits of cardiac catheterization have been discussed with the patient.  The patient understands that risks included but are not limited to stroke (1 in 1000), death (1 in 82), kidney failure [usually temporary] (1 in 500), bleeding (1 in 200), allergic reaction [possibly serious] (1 in 200). The patient understands and agrees to proceed.   Derryck Shahan C. Oval Linsey, MD, [facility]  11/07/2021 2:44 PM

## 2021-11-07 NOTE — Telephone Encounter (Signed)
Here ya go!  

## 2021-11-07 NOTE — Telephone Encounter (Signed)
Patient calling back upset wanting to know the results from her test. She stating that know is causing her panic attach. She doesn't wan to have a heart attach because she dont know her results. Please advise

## 2021-11-07 NOTE — Telephone Encounter (Signed)
Dr Oval Linsey called and spoke with patient regarding Cardiac CT   Patient needs cardiac cath  Cath scheduled 11/13/2021 1:00 pm with Dr Burt Knack

## 2021-11-08 ENCOUNTER — Telehealth (HOSPITAL_BASED_OUTPATIENT_CLINIC_OR_DEPARTMENT_OTHER): Payer: Self-pay | Admitting: Cardiovascular Disease

## 2021-11-08 NOTE — Telephone Encounter (Signed)
Spoke with patient, order in Epic for CBC Monday BMET done 6/8  See phone note 6/16

## 2021-11-08 NOTE — Telephone Encounter (Signed)
New Message:      Patient would like for [facility] to give her a call back today please. She said to please refer to the messages she sent in My Chart.

## 2021-11-08 NOTE — Telephone Encounter (Signed)
Here ya go!  

## 2021-11-08 NOTE — Telephone Encounter (Signed)
Spoke with patient, order in Epic for CBC Monday BMET done 6/8

## 2021-11-11 LAB — CBC WITH DIFFERENTIAL/PLATELET
Basophils Absolute: 0.1 10*3/uL (ref 0.0–0.2)
Basos: 1 %
EOS (ABSOLUTE): 0.1 10*3/uL (ref 0.0–0.4)
Eos: 2 %
Hematocrit: 44.9 % (ref 34.0–46.6)
Hemoglobin: 15 g/dL (ref 11.1–15.9)
Immature Grans (Abs): 0 10*3/uL (ref 0.0–0.1)
Immature Granulocytes: 0 %
Lymphocytes Absolute: 1.4 10*3/uL (ref 0.7–3.1)
Lymphs: 23 %
MCH: 30.1 pg (ref 26.6–33.0)
MCHC: 33.4 g/dL (ref 31.5–35.7)
MCV: 90 fL (ref 79–97)
Monocytes Absolute: 0.3 10*3/uL (ref 0.1–0.9)
Monocytes: 6 %
Neutrophils Absolute: 3.9 10*3/uL (ref 1.4–7.0)
Neutrophils: 68 %
Platelets: 125 10*3/uL — ABNORMAL LOW (ref 150–450)
RBC: 4.99 x10E6/uL (ref 3.77–5.28)
RDW: 12.6 % (ref 11.7–15.4)
WBC: 5.8 10*3/uL (ref 3.4–10.8)

## 2021-11-12 ENCOUNTER — Telehealth: Payer: Self-pay | Admitting: *Deleted

## 2021-11-12 NOTE — Telephone Encounter (Signed)
Cardiac Catheterization scheduled at [facility] for: Wednesday November 13, 2021 1 PM Arrival time and place: [place] Entrance A at: 11 AM    Nothing to eat after midnight prior to procedure, clear liquids until 5 AM day of procedure.  Medication instructions: -Usual morning medications can be taken with sips of water including aspirin 81 mg.  Confirmed patient has responsible adult to drive home post procedure and be with patient first 24 hours after arriving home.  Patient reports no new symptoms concerning for COVID-19 in past 10 days.  Reviewed procedure instructions with patient.

## 2021-11-13 ENCOUNTER — Ambulatory Visit (HOSPITAL_COMMUNITY)
Admission: RE | Admit: 2021-11-13 | Discharge: 2021-11-13 | Disposition: A | Discharge status: Home / Self Care | Payer: No Typology Code available for payment source | Attending: Cardiovascular Disease | Admitting: Cardiovascular Disease

## 2021-11-13 ENCOUNTER — Encounter (HOSPITAL_COMMUNITY)
Admission: RE | Disposition: A | Discharge status: Home / Self Care | Payer: Self-pay | Source: Home / Self Care | Attending: Cardiovascular Disease

## 2021-11-13 ENCOUNTER — Other Ambulatory Visit: Payer: Self-pay

## 2021-11-13 DIAGNOSIS — J449 Chronic obstructive pulmonary disease, unspecified: Secondary | ICD-10-CM | POA: Insufficient documentation

## 2021-11-13 DIAGNOSIS — I2584 Coronary atherosclerosis due to calcified coronary lesion: Secondary | ICD-10-CM | POA: Diagnosis not present

## 2021-11-13 DIAGNOSIS — I209 Angina pectoris, unspecified: Secondary | ICD-10-CM

## 2021-11-13 DIAGNOSIS — F17210 Nicotine dependence, cigarettes, uncomplicated: Secondary | ICD-10-CM | POA: Insufficient documentation

## 2021-11-13 DIAGNOSIS — Z8616 Personal history of COVID-19: Secondary | ICD-10-CM | POA: Insufficient documentation

## 2021-11-13 DIAGNOSIS — I2510 Atherosclerotic heart disease of native coronary artery without angina pectoris: Secondary | ICD-10-CM | POA: Diagnosis not present

## 2021-11-13 DIAGNOSIS — E039 Hypothyroidism, unspecified: Secondary | ICD-10-CM | POA: Insufficient documentation

## 2021-11-13 DIAGNOSIS — E7800 Pure hypercholesterolemia, unspecified: Secondary | ICD-10-CM | POA: Insufficient documentation

## 2021-11-13 DIAGNOSIS — R931 Abnormal findings on diagnostic imaging of heart and coronary circulation: Secondary | ICD-10-CM | POA: Insufficient documentation

## 2021-11-13 LAB — POCT ACTIVATED CLOTTING TIME: Activated Clotting Time: 287 seconds

## 2021-11-13 SURGERY — LEFT HEART CATH AND CORONARY ANGIOGRAPHY
Anesthesia: LOCAL

## 2021-11-13 MED ORDER — SODIUM CHLORIDE 0.9 % IV SOLN: 250.0000 mL | INTRAVENOUS | Status: DC | PRN

## 2021-11-13 MED ORDER — VERAPAMIL HCL 2.5 MG/ML IV SOLN
INTRAVENOUS | Status: DC | PRN
  Administered 2021-11-13: 10 mL via INTRA_ARTERIAL

## 2021-11-13 MED ORDER — ADENOSINE (DIAGNOSTIC) 140MCG/KG/MIN
INTRAVENOUS | Status: DC | PRN
  Administered 2021-11-13: 140 ug/kg/min via INTRAVENOUS

## 2021-11-13 MED ORDER — HEPARIN SODIUM (PORCINE) 1000 UNIT/ML IJ SOLN
INTRAMUSCULAR | Status: AC
  Filled 2021-11-13: qty 10

## 2021-11-13 MED ORDER — SODIUM CHLORIDE 0.9% FLUSH: 3.0000 mL | INTRAVENOUS | Status: DC | PRN

## 2021-11-13 MED ORDER — HEPARIN (PORCINE) IN NACL 1000-0.9 UT/500ML-% IV SOLN
INTRAVENOUS | Status: AC
  Filled 2021-11-13: qty 1000

## 2021-11-13 MED ORDER — FENTANYL CITRATE (PF) 100 MCG/2ML IJ SOLN
INTRAMUSCULAR | Status: AC
  Filled 2021-11-13: qty 2

## 2021-11-13 MED ORDER — ASPIRIN 81 MG PO CHEW: 81.0000 mg | CHEWABLE_TABLET | ORAL | Status: DC

## 2021-11-13 MED ORDER — SODIUM CHLORIDE 0.9% FLUSH: 3.0000 mL | Freq: Two times a day (BID) | INTRAVENOUS | Status: DC

## 2021-11-13 MED ORDER — VERAPAMIL HCL 2.5 MG/ML IV SOLN
INTRAVENOUS | Status: AC
  Filled 2021-11-13: qty 2

## 2021-11-13 MED ORDER — SODIUM CHLORIDE 0.9 % WEIGHT BASED INFUSION
3.0000 mL/kg/h | INTRAVENOUS | Status: AC
  Administered 2021-11-13: 3 mL/kg/h via INTRAVENOUS

## 2021-11-13 MED ORDER — LIDOCAINE HCL (PF) 1 % IJ SOLN
INTRAMUSCULAR | Status: AC
  Filled 2021-11-13: qty 30

## 2021-11-13 MED ORDER — ADENOSINE 12 MG/4ML IV SOLN
INTRAVENOUS | Status: AC
  Filled 2021-11-13: qty 16

## 2021-11-13 MED ORDER — MIDAZOLAM HCL 2 MG/2ML IJ SOLN
INTRAMUSCULAR | Status: AC
  Filled 2021-11-13: qty 2

## 2021-11-13 MED ORDER — FENTANYL CITRATE (PF) 100 MCG/2ML IJ SOLN
INTRAMUSCULAR | Status: DC | PRN
  Administered 2021-11-13: 25 ug via INTRAVENOUS
  Administered 2021-11-13: 50 ug via INTRAVENOUS
  Administered 2021-11-13: 25 ug via INTRAVENOUS
  Administered 2021-11-13: 50 ug via INTRAVENOUS

## 2021-11-13 MED ORDER — HEPARIN (PORCINE) IN NACL 1000-0.9 UT/500ML-% IV SOLN
INTRAVENOUS | Status: DC | PRN
  Administered 2021-11-13 (×2): 500 mL

## 2021-11-13 MED ORDER — ONDANSETRON HCL 4 MG/2ML IJ SOLN: 4.0000 mg | Freq: Four times a day (QID) | INTRAMUSCULAR | Status: DC | PRN

## 2021-11-13 MED ORDER — SODIUM CHLORIDE 0.9 % WEIGHT BASED INFUSION: 1.0000 mL/kg/h | INTRAVENOUS | Status: DC

## 2021-11-13 MED ORDER — HEPARIN SODIUM (PORCINE) 1000 UNIT/ML IJ SOLN
INTRAMUSCULAR | Status: DC | PRN
  Administered 2021-11-13: 1000 [IU] via INTRAVENOUS
  Administered 2021-11-13: 3500 [IU] via INTRAVENOUS

## 2021-11-13 MED ORDER — IOHEXOL 350 MG/ML SOLN
INTRAVENOUS | Status: DC | PRN
  Administered 2021-11-13: 45 mL

## 2021-11-13 MED ORDER — HYDRALAZINE HCL 20 MG/ML IJ SOLN: 10.0000 mg | INTRAMUSCULAR | Status: DC | PRN

## 2021-11-13 MED ORDER — LABETALOL HCL 5 MG/ML IV SOLN: 10.0000 mg | INTRAVENOUS | Status: DC | PRN

## 2021-11-13 MED ORDER — LIDOCAINE HCL (PF) 1 % IJ SOLN
INTRAMUSCULAR | Status: DC | PRN
  Administered 2021-11-13: 2 mL

## 2021-11-13 MED ORDER — MIDAZOLAM HCL 2 MG/2ML IJ SOLN
INTRAMUSCULAR | Status: DC | PRN
  Administered 2021-11-13 (×3): 2 mg via INTRAVENOUS

## 2021-11-13 MED ORDER — ACETAMINOPHEN 325 MG PO TABS: 650.0000 mg | ORAL_TABLET | ORAL | Status: DC | PRN

## 2021-11-13 MED ORDER — CLOPIDOGREL BISULFATE 75 MG PO TABS: 75.0000 mg | ORAL_TABLET | Freq: Every day | ORAL | 11 refills | Status: DC

## 2021-11-13 MED ORDER — SODIUM CHLORIDE 0.9 % WEIGHT BASED INFUSION
1.0000 mL/kg/h | INTRAVENOUS | Status: DC
Start: 2021-11-13 — End: 2021-11-13

## 2021-11-13 SURGICAL SUPPLY — 12 items
BAND ZEPHYR COMPRESS 30 LONG (HEMOSTASIS) ×1 IMPLANT
CATH 5FR JL3.5 JR4 ANG PIG MP (CATHETERS) ×1 IMPLANT
CATH LAUNCHER 5F EBU3.5 (CATHETERS) ×1 IMPLANT
GLIDESHEATH SLEND SS 6F .021 (SHEATH) ×1 IMPLANT
GUIDEWIRE INQWIRE 1.5J.035X260 (WIRE) IMPLANT
GUIDEWIRE PRESSURE X 175 (WIRE) ×1 IMPLANT
INQWIRE 1.5J .035X260CM (WIRE) ×4
KIT HEART LEFT (KITS) ×2 IMPLANT
PACK CARDIAC CATHETERIZATION (CUSTOM PROCEDURE TRAY) ×2 IMPLANT
TRANSDUCER W/STOPCOCK (MISCELLANEOUS) ×2 IMPLANT
TUBING CIL FLEX 10 FLL-RA (TUBING) ×2 IMPLANT
WIRE HI TORQ VERSACORE-J 145CM (WIRE) ×1 IMPLANT

## 2021-11-13 NOTE — Discharge Instructions (Signed)

## 2021-11-13 NOTE — Interval H&P Note (Signed)
History and Physical Interval Note:  11/13/2021 1:17 PM  Wendy Bryan  has presented today for surgery, with the diagnosis of abnormal cardiac ct.  The various methods of treatment have been discussed with the patient and family. After consideration of risks, benefits and other options for treatment, the patient has consented to  Procedure(s): LEFT HEART CATH AND CORONARY ANGIOGRAPHY (N/A) as a surgical intervention.  The patient's history has been reviewed, patient examined, no change in status, stable for surgery.  I have reviewed the patient's chart and labs.  Questions were answered to the patient's satisfaction.     Sherren Mocha

## 2021-11-13 NOTE — Progress Notes (Signed)
Patient and husband was given discharge instructions. Both verbalized understanding. 

## 2021-11-14 ENCOUNTER — Encounter (HOSPITAL_COMMUNITY): Payer: Self-pay | Admitting: Cardiovascular Disease

## 2021-11-18 ENCOUNTER — Other Ambulatory Visit: Payer: Self-pay | Admitting: *Deleted

## 2021-11-18 ENCOUNTER — Telehealth: Payer: Self-pay | Admitting: *Deleted

## 2021-11-18 DIAGNOSIS — I2510 Atherosclerotic heart disease of native coronary artery without angina pectoris: Secondary | ICD-10-CM

## 2021-11-18 DIAGNOSIS — Z01812 Encounter for preprocedural laboratory examination: Secondary | ICD-10-CM

## 2021-11-20 NOTE — Telephone Encounter (Signed)
Patient called stating she is returning RN's call about the knot on right arm.  She started taking Aleve for the pain which went away but she is concerned that the knot is still there.  Please advise.

## 2021-11-20 NOTE — Telephone Encounter (Addendum)
Patient reports right arm is feeling better, still has small knot inner arm at elbow, smaller, pain is improving, no new symptoms.  Patient reports she has been using Aleve for pain relief. Patient advised to use tylenol prn pain instead of Aleve,  limit use of right arm, continue to observe for any changes. Patient going for lab today, I will plan to follow up with patient tomorrow to review instructions for coronary atherectomy 11/25/21.

## 2021-11-21 LAB — BASIC METABOLIC PANEL
BUN/Creatinine Ratio: 18 (ref 12–28)
BUN: 12 mg/dL (ref 8–27)
CO2: 23 mmol/L (ref 20–29)
Calcium: 9.9 mg/dL (ref 8.7–10.3)
Chloride: 102 mmol/L (ref 96–106)
Creatinine, Ser: 0.66 mg/dL (ref 0.57–1.00)
Glucose: 85 mg/dL (ref 70–99)
Potassium: 4 mmol/L (ref 3.5–5.2)
Sodium: 141 mmol/L (ref 134–144)
eGFR: 100 mL/min/{1.73_m2} (ref >59)

## 2021-11-21 LAB — CBC
Hematocrit: 45.3 % (ref 34.0–46.6)
Hemoglobin: 15 g/dL (ref 11.1–15.9)
MCH: 30.2 pg (ref 26.6–33.0)
MCHC: 33.1 g/dL (ref 31.5–35.7)
MCV: 91 fL (ref 79–97)
Platelets: 145 10*3/uL — ABNORMAL LOW (ref 150–450)
RBC: 4.96 x10E6/uL (ref 3.77–5.28)
RDW: 12.7 % (ref 11.7–15.4)
WBC: 5.9 10*3/uL (ref 3.4–10.8)

## 2021-11-21 NOTE — Telephone Encounter (Addendum)
Patient reports soreness right arm at inside elbow is improving, knot no longer visible, only soreness if she presses on that area. Patient comfortable with proceeding with coronary atherectomy Monday November 25, 2021.  Reviewed procedure instructions with patient.

## 2021-11-22 ENCOUNTER — Telehealth: Payer: Self-pay | Admitting: Cardiovascular Disease

## 2021-11-22 NOTE — Telephone Encounter (Signed)
Message previously routed to [employer]

## 2021-11-22 NOTE — Telephone Encounter (Signed)
Pt spoke to Gregery Na, RN yesterday about a rash under her arm. PCP prescribed diflucan and Lotrisone (cream) and she wants to know if it is okay to take and use before her procedure.

## 2021-11-22 NOTE — Telephone Encounter (Signed)
  Pt is calling back to f/u. She would like to get a call today

## 2021-11-22 NOTE — Telephone Encounter (Signed)
Please advise 

## 2021-11-22 NOTE — Telephone Encounter (Signed)
Informed patient of pharmacist's reply that it is okay to use the medications (diflucan orally and Lotrisone cream) before her procedure.  Patient then stated she looked up diflucan on Google and got scared when it said it can increase heart rate. She is concerned about her Monday procedure and these medications. Sje is waiting for call from [employer]  .

## 2021-11-22 NOTE — Telephone Encounter (Signed)
Yes, it is ok to use these medications before procedure

## 2021-11-22 NOTE — Telephone Encounter (Signed)
Spoke with patient, again gave her information about medications and pharmacists okay to use diflucan and Lotrisone cream

## 2021-11-25 ENCOUNTER — Other Ambulatory Visit (HOSPITAL_COMMUNITY): Payer: Self-pay

## 2021-11-25 ENCOUNTER — Telehealth: Payer: Self-pay | Admitting: Physician Assistant

## 2021-11-25 ENCOUNTER — Encounter (HOSPITAL_COMMUNITY)
Admission: RE | Disposition: A | Discharge status: Home / Self Care | Payer: Self-pay | Source: Home / Self Care | Attending: Cardiovascular Disease

## 2021-11-25 ENCOUNTER — Other Ambulatory Visit: Payer: Self-pay

## 2021-11-25 ENCOUNTER — Ambulatory Visit (HOSPITAL_COMMUNITY)
Admission: RE | Admit: 2021-11-25 | Discharge: 2021-11-25 | Disposition: A | Discharge status: Home / Self Care | Payer: PRIVATE HEALTH INSURANCE | Attending: Cardiovascular Disease | Admitting: Cardiovascular Disease

## 2021-11-25 DIAGNOSIS — I25119 Atherosclerotic heart disease of native coronary artery with unspecified angina pectoris: Secondary | ICD-10-CM | POA: Diagnosis not present

## 2021-11-25 DIAGNOSIS — J449 Chronic obstructive pulmonary disease, unspecified: Secondary | ICD-10-CM | POA: Diagnosis not present

## 2021-11-25 DIAGNOSIS — E7800 Pure hypercholesterolemia, unspecified: Secondary | ICD-10-CM | POA: Insufficient documentation

## 2021-11-25 DIAGNOSIS — Z955 Presence of coronary angioplasty implant and graft: Secondary | ICD-10-CM | POA: Insufficient documentation

## 2021-11-25 DIAGNOSIS — Z79899 Other long term (current) drug therapy: Secondary | ICD-10-CM | POA: Diagnosis not present

## 2021-11-25 DIAGNOSIS — E039 Hypothyroidism, unspecified: Secondary | ICD-10-CM | POA: Diagnosis not present

## 2021-11-25 DIAGNOSIS — F17200 Nicotine dependence, unspecified, uncomplicated: Secondary | ICD-10-CM | POA: Diagnosis present

## 2021-11-25 DIAGNOSIS — F17210 Nicotine dependence, cigarettes, uncomplicated: Secondary | ICD-10-CM | POA: Insufficient documentation

## 2021-11-25 DIAGNOSIS — I2584 Coronary atherosclerosis due to calcified coronary lesion: Secondary | ICD-10-CM | POA: Insufficient documentation

## 2021-11-25 HISTORY — PX: CORONARY ATHERECTOMY: CATH118238

## 2021-11-25 HISTORY — PX: CORONARY STENT INTERVENTION: CATH118234

## 2021-11-25 LAB — POCT ACTIVATED CLOTTING TIME: Activated Clotting Time: 426 seconds

## 2021-11-25 SURGERY — CORONARY ATHERECTOMY
Anesthesia: LOCAL

## 2021-11-25 MED ORDER — LIDOCAINE HCL (PF) 1 % IJ SOLN
INTRAMUSCULAR | Status: AC
  Filled 2021-11-25: qty 30

## 2021-11-25 MED ORDER — FENTANYL CITRATE (PF) 100 MCG/2ML IJ SOLN
INTRAMUSCULAR | Status: DC | PRN
  Administered 2021-11-25 (×4): 50 ug via INTRAVENOUS

## 2021-11-25 MED ORDER — ONDANSETRON HCL 4 MG/2ML IJ SOLN: 4.0000 mg | Freq: Four times a day (QID) | INTRAMUSCULAR | Status: DC | PRN

## 2021-11-25 MED ORDER — MIDAZOLAM HCL 2 MG/2ML IJ SOLN
INTRAMUSCULAR | Status: AC
  Filled 2021-11-25: qty 2

## 2021-11-25 MED ORDER — SODIUM CHLORIDE 0.9% FLUSH: 3.0000 mL | Freq: Two times a day (BID) | INTRAVENOUS | Status: DC

## 2021-11-25 MED ORDER — SODIUM CHLORIDE 0.9 % WEIGHT BASED INFUSION
3.0000 mL/kg/h | INTRAVENOUS | Status: AC
  Administered 2021-11-25: 3 mL/kg/h via INTRAVENOUS

## 2021-11-25 MED ORDER — LABETALOL HCL 5 MG/ML IV SOLN: 10.0000 mg | INTRAVENOUS | Status: DC | PRN

## 2021-11-25 MED ORDER — HEPARIN (PORCINE) IN NACL 1000-0.9 UT/500ML-% IV SOLN
INTRAVENOUS | Status: AC
Start: 2021-11-25 — End: ?
  Filled 2021-11-25: qty 1000

## 2021-11-25 MED ORDER — DIAZEPAM 5 MG PO TABS: 5.0000 mg | ORAL_TABLET | Freq: Four times a day (QID) | ORAL | Status: DC | PRN

## 2021-11-25 MED ORDER — SODIUM CHLORIDE 0.9 % WEIGHT BASED INFUSION: 1.0000 mL/kg/h | INTRAVENOUS | Status: DC

## 2021-11-25 MED ORDER — SODIUM CHLORIDE 0.9% FLUSH: 3.0000 mL | INTRAVENOUS | Status: DC | PRN

## 2021-11-25 MED ORDER — HEPARIN SODIUM (PORCINE) 1000 UNIT/ML IJ SOLN
INTRAMUSCULAR | Status: AC
Start: 2021-11-25 — End: ?
  Filled 2021-11-25: qty 10

## 2021-11-25 MED ORDER — HEPARIN (PORCINE) IN NACL 1000-0.9 UT/500ML-% IV SOLN
INTRAVENOUS | Status: DC | PRN
  Administered 2021-11-25 (×2): 500 mL

## 2021-11-25 MED ORDER — IOHEXOL 350 MG/ML SOLN
INTRAVENOUS | Status: DC | PRN
  Administered 2021-11-25: 110 mL via INTRA_ARTERIAL

## 2021-11-25 MED ORDER — SODIUM CHLORIDE 0.9 % IV SOLN
INTRAVENOUS | Status: DC | PRN
  Administered 2021-11-25: 1.75 mg/kg/h via INTRAVENOUS

## 2021-11-25 MED ORDER — SODIUM CHLORIDE 0.9 % IV BOLUS
INTRAVENOUS | Status: DC | PRN
  Administered 2021-11-25: 300 mL via INTRAVENOUS

## 2021-11-25 MED ORDER — NITROGLYCERIN 1 MG/10 ML FOR IR/CATH LAB
INTRA_ARTERIAL | Status: DC | PRN
  Administered 2021-11-25 (×2): 100 ug via INTRACORONARY

## 2021-11-25 MED ORDER — MORPHINE SULFATE (PF) 2 MG/ML IV SOLN: 2.0000 mg | INTRAVENOUS | Status: DC | PRN

## 2021-11-25 MED ORDER — ASPIRIN 81 MG PO CHEW: 81.0000 mg | CHEWABLE_TABLET | ORAL | Status: DC

## 2021-11-25 MED ORDER — CLOPIDOGREL BISULFATE 75 MG PO TABS: 75.0000 mg | ORAL_TABLET | ORAL | Status: DC

## 2021-11-25 MED ORDER — SODIUM CHLORIDE 0.9 % IV SOLN: 250.0000 mL | INTRAVENOUS | Status: DC | PRN

## 2021-11-25 MED ORDER — NITROGLYCERIN 0.4 MG SL SUBL
0.4000 mg | SUBLINGUAL_TABLET | SUBLINGUAL | 3 refills | Status: AC | PRN
  Filled 2021-11-25: qty 25, 14d supply, fill #0

## 2021-11-25 MED ORDER — FENTANYL CITRATE (PF) 100 MCG/2ML IJ SOLN
INTRAMUSCULAR | Status: AC
  Filled 2021-11-25: qty 2

## 2021-11-25 MED ORDER — SODIUM CHLORIDE 0.9 % IV SOLN
INTRAVENOUS | Status: DC | PRN
  Administered 2021-11-25: 47.6 mL/h via INTRAVENOUS

## 2021-11-25 MED ORDER — HYDRALAZINE HCL 20 MG/ML IJ SOLN: 10.0000 mg | INTRAMUSCULAR | Status: DC | PRN

## 2021-11-25 MED ORDER — LIDOCAINE HCL (PF) 1 % IJ SOLN
INTRAMUSCULAR | Status: DC | PRN
  Administered 2021-11-25: 5 mL via INTRADERMAL

## 2021-11-25 MED ORDER — OXYCODONE HCL 5 MG PO TABS: 5.0000 mg | ORAL_TABLET | ORAL | Status: DC | PRN

## 2021-11-25 MED ORDER — ACETAMINOPHEN 325 MG PO TABS: 650.0000 mg | ORAL_TABLET | ORAL | Status: DC | PRN

## 2021-11-25 MED ORDER — BIVALIRUDIN BOLUS VIA INFUSION - CUPID
INTRAVENOUS | Status: DC | PRN
  Administered 2021-11-25: 35.7 mg via INTRAVENOUS

## 2021-11-25 MED ORDER — MIDAZOLAM HCL 2 MG/2ML IJ SOLN
INTRAMUSCULAR | Status: DC | PRN
  Administered 2021-11-25 (×3): 2 mg via INTRAVENOUS

## 2021-11-25 MED ORDER — ROSUVASTATIN CALCIUM 5 MG PO TABS
5.0000 mg | ORAL_TABLET | Freq: Every day | ORAL | 5 refills | Status: DC
  Filled 2021-11-25: qty 30, 30d supply, fill #0

## 2021-11-25 MED ORDER — NITROGLYCERIN 1 MG/10 ML FOR IR/CATH LAB
INTRA_ARTERIAL | Status: AC
  Filled 2021-11-25: qty 10

## 2021-11-25 SURGICAL SUPPLY — 24 items
BALLN SAPPHIRE 2.5X12 (BALLOONS) ×2
BALLN ~~LOC~~ EUPHORA RX 2.75X20 (BALLOONS) ×2
BALLOON SAPPHIRE 2.5X12 (BALLOONS) IMPLANT
BALLOON ~~LOC~~ EUPHORA RX 2.75X20 (BALLOONS) IMPLANT
CATH VISTA GUIDE 6FR XBLAD3.5 (CATHETERS) ×1 IMPLANT
CLOSURE PERCLOSE PROSTYLE (VASCULAR PRODUCTS) ×1 IMPLANT
CROWN DIAMONDBACK CLASSIC 1.25 (BURR) ×1 IMPLANT
ELECT DEFIB PAD ADLT CADENCE (PAD) ×1 IMPLANT
KIT ENCORE 26 ADVANTAGE (KITS) ×1 IMPLANT
KIT HEART LEFT (KITS) ×2 IMPLANT
LUBRICANT VIPERSLIDE CORONARY (MISCELLANEOUS) ×1 IMPLANT
PACK CARDIAC CATHETERIZATION (CUSTOM PROCEDURE TRAY) ×2 IMPLANT
SHEATH PINNACLE 6F 10CM (SHEATH) ×1 IMPLANT
STENT SYNERGY XD 2.50X32 (Permanent Stent) IMPLANT
STENT SYNERGY XD 3.0X12 (Permanent Stent) IMPLANT
SYNERGY XD 2.50X32 (Permanent Stent) ×2 IMPLANT
SYNERGY XD 3.0X12 (Permanent Stent) ×2 IMPLANT
TRANSDUCER W/STOPCOCK (MISCELLANEOUS) ×2 IMPLANT
TUBING CIL FLEX 10 FLL-RA (TUBING) ×3 IMPLANT
VALVE GUARDIAN II ~~LOC~~ HEMO (MISCELLANEOUS) ×1 IMPLANT
WIRE COUGAR XT STRL 190CM (WIRE) ×1 IMPLANT
WIRE EMERALD 3MM-J .035X150CM (WIRE) ×1 IMPLANT
WIRE MICRO SET SILHO 5FR 7 (SHEATH) ×1 IMPLANT
WIRE VIPERWIRE COR FLEX .012 (WIRE) ×1 IMPLANT

## 2021-11-25 NOTE — Discharge Summary (Addendum)
Discharge Summary for Same Day PCI   Patient ID: Wendy Bryan MRN: [id number]; DOB: 09/27/1960  Admit date: 11/25/2021 Discharge date: 11/25/2021  Primary Care Provider: Redmond School, MD  Primary Cardiologist: Skeet Latch, MD  Primary Electrophysiologist:  None   Discharge Diagnoses    Principal Problem:   Coronary artery disease involving native coronary artery of native heart with angina pectoris [facility]) Active Problems:   Tobacco dependence   Pure hypercholesterolemia    Diagnostic Studies/Procedures    Cardiac Catheterization 11/25/2021:    Ost LAD to Prox LAD lesion is 75% stenosed.   Mid LAD lesion is 70% stenosed.   Mid Cx lesion is 40% stenosed.   A drug-eluting stent was successfully placed using a SYNERGY XD 2.50X32.   A drug-eluting stent was successfully placed using a SYNERGY XD 3.0X12.   Post intervention, there is a 0% residual stenosis.   Post intervention, there is a 0% residual stenosis.   Successful orbital atherectomy and stenting of severe calcific tandem stenoses in the proximal and mid LAD using a CSI 1.25 mm classic crown followed by a 2.5 x 32 mm Synergy DES overlapped proximally with a 3.0 x 12 mm Synergy DES   Same-day PCI protocol if criteria met.  Dual antiplatelet therapy with aspirin and clopidogrel favor 12 months of therapy in the setting of the overlapping DES in the proximal/mid LAD. _____________   History of Present Illness     Wendy Bryan is a 61 y.o. female with tobacco abuse, hypothyroidism, HLD, COPD, and recently diagnosed CAD who presented to [facility] for planned cardiac catheterization. She was recently evaluated for sensation of fullness in her chest and increase in shortness of breath with exertion and cough.  She underwent coronary CT which showed CAD 415 (97%ile) with concern for flow limiting lesion in the LAD by FFR. Diagnosed cath was performed 11/13/21 showing patent left main, left circumflex, and RCA with mild  nonobstructive plaquing of the left circumflex, but severe calcific stenosis of the proximal LAD and moderately severe focal stenosis of the mid LAD, hemodynamically significant by RFR and FFR. She was started on Plavix. It was arranged for her to return for planned PCI.   Hospital Course     The patient underwent cardiac cath as noted above with orbital atherectomy/stenting to the tandem prox and mid LAD with overlapping stents. Plan for DAPT with ASA/Plavix for at least 12 months in setting of overlapping DES. The patient was seen by cardiac rehab while in short stay. There were no observed complications post cath. Groin cath site was re-evaluated prior to discharge and found to be stable without any complications. Instructions/precautions regarding cath site care were given prior to discharge. She reports she is retired so work note not needed.  Wendy Bryan was seen by Dr. Burt Knack and determined stable for discharge home. Follow up with our office has been arranged. Medications are listed below. Pertinent changes include addition of low dose rosuvastatin (patient did not want higher dose) and PRN SL NTG. If the patient is tolerating statin at time of follow-up appointment, would consider rechecking liver function/lipid panel in 6-8 weeks. _____________  Cath/PCI Registry Performance & Quality Measures: Aspirin prescribed? - Yes ADP Receptor Inhibitor (Plavix/Clopidogrel, Brilinta/Ticagrelor or Effient/Prasugrel) prescribed (includes medically managed patients)? - Yes High Intensity Statin (Lipitor 40-51m or Crestor 20-486m prescribed? - No - patient preference For EF <40%, was ACEI/ARB prescribed? - Not Applicable (EF >/= 4035%For EF <40%, Aldosterone Antagonist (Spironolactone or  Eplerenone) prescribed? - Not Applicable (EF >/= 95%) Cardiac Rehab Phase II ordered (Included Medically managed Patients)? - Yes _____________   Discharge Vitals Blood pressure 106/75, pulse 66, temperature  98.3 F (36.8 C), temperature source Temporal, resp. rate 14, height '5\' 11"'  (1.803 m), weight 47.6 kg, SpO2 96 %.  Filed Weights   11/25/21 0919  Weight: 47.6 kg    Last Labs & Radiologic Studies    No labs this encounter _____________  CARDIAC CATHETERIZATION  Result Date: 11/25/2021   Ost LAD to Prox LAD lesion is 75% stenosed.   Mid LAD lesion is 70% stenosed.   Mid Cx lesion is 40% stenosed.   A drug-eluting stent was successfully placed using a SYNERGY XD 2.50X32.   A drug-eluting stent was successfully placed using a SYNERGY XD 3.0X12.   Post intervention, there is a 0% residual stenosis.   Post intervention, there is a 0% residual stenosis. Successful orbital atherectomy and stenting of severe calcific tandem stenoses in the proximal and mid LAD using a CSI 1.25 mm classic crown followed by a 2.5 x 32 mm Synergy DES overlapped proximally with a 3.0 x 12 mm Synergy DES Same-day PCI protocol if criteria met.  Dual antiplatelet therapy with aspirin and clopidogrel favor 12 months of therapy in the setting of the overlapping DES in the proximal/mid LAD.   CARDIAC CATHETERIZATION  Result Date: 11/13/2021 1.  Patent left main, left circumflex, and RCA with mild nonobstructive plaquing of the left circumflex 2.  Severe calcific stenosis of the proximal LAD and moderately severe focal stenosis of the mid LAD, hemodynamically significant by RFR of 0.89 and FFR of 0.78 with IV adenosine Recommendations: Staged PCI of the LAD.  This procedure will require orbital atherectomy which could not be performed through this patient's right radial artery access because the vessel was too small to accommodate a 6 French sheath or catheter.  We will start her on clopidogrel and bring her back for orbital atherectomy, PTCA, and stenting of the LAD.  She will need both the proximal LAD and mid LAD lesions treated.  Plan discussed with the patient and her husband who are in agreement.  Clopidogrel was called into her  local pharmacy.   CT CORONARY FRACTIONAL FLOW RESERVE DATA PREP  Result Date: 11/06/2021 EXAM: FFRCT ANALYSIS FINDINGS: FFRct analysis was performed on the original cardiac CT angiogram dataset. Diagrammatic representation of the FFRct analysis is provided in a separate PDF document in PACS. This dictation was created using the PDF document and an interactive 3D model of the results. 3D model is not available in the EMR/PACS. Normal FFR range is >0.80. 1. Left Main: LM FFR = 1.00. 2. LAD: Proximal FFR = 0.99, Mid FFR = 0.84, Distal FFR = 0.63. 3. LCX: Proximal FFR = 0.99, Mid FFR = 0.91, Distal FFR = 0.88. 4. RCA: Proximal FFR = 0.99, Mid FFR = 0.93. Distal RCA was not modeled. IMPRESSION: IMPRESSION 1. Coronary CTA FFR analysis demonstrates possible flow limiting lesion in the mid to distal LAD (prox FFR 0.99>>0.84>>distal FFR 0.63. Coronary CTA demonstrated a 50-69% lesion in the mid LAD and 25-49% lesion in the distal LAD. 2.  Consider cardiac catheterization. Fransico Him, MD Electronically Signed   By: Fransico Him M.D.   On: 11/06/2021 22:24   CT CORONARY MORPH W/CTA COR W/SCORE W/CA W/CM &/OR WO/CM  Addendum Date: 11/05/2021   ADDENDUM REPORT: 11/05/2021 16:09 CLINICAL DATA:  This over-read does not include interpretation of cardiac or coronary  anatomy or pathology. The coronary CTA interpretation by the cardiologist is attached. COMPARISON:  None available. FINDINGS: No suspicious nodules, masses, or infiltrates are identified in the visualized portion of the lungs. No pleural fluid seen. The visualized portions of the mediastinum and chest wall are unremarkable. IMPRESSION: No significant non-cardiac abnormality identified. Electronically Signed   By: Marlaine Hind M.D.   On: 11/05/2021 16:09   Result Date: 11/05/2021 CLINICAL DATA:  Chest pain EXAM: Cardiac/Coronary CTA TECHNIQUE: A non-contrast, gated CT scan was obtained with axial slices of 3 mm through the heart for calcium scoring. Calcium  scoring was performed using the Agatston method. A 120 kV prospective, gated, contrast cardiac scan was obtained. Gantry rotation speed was 250 msecs and collimation was 0.6 mm. Two sublingual nitroglycerin tablets (0.8 mg) were given. The 3D data set was reconstructed in 5% intervals of the 35-75% of the R-R cycle. Diastolic phases were analyzed on a dedicated workstation using MPR, MIP, and VRT modes. The patient received 95 cc of contrast. FINDINGS: Image quality: Excellent. Noise artifact is: Limited. Coronary Arteries:  Normal coronary origin.  Right dominance. Left main: The left main is a large caliber vessel with a normal take off from the left coronary cusp that bifurcates to form a left anterior descending artery and a left circumflex artery. There is no plaque or stenosis. Left anterior descending artery: The LAD gives off 2 patent diagonal branches. There is moderate calcified plaque in the proximal and mid LAD with associated stenosis of 50-69%. There is mild calcified plaque in the distal LAD with associated stenosis of 25-49%. Left circumflex artery: The LCX is non-dominant and gives off a large trifurcating OM1. There is minimal calcified plaque in the proximal LCx with associated stenosis of < 25%. There is mild calcified plaque in the proximal to mid LCx extending into the ostium of a large trifurcating OM1 with associated stenosis of 25-49%. Right coronary artery: The RCA is dominant with normal take off from the right coronary cusp. There is no evidence of plaque or stenosis. The RCA terminates as a PDA and right posterolateral branch without evidence of plaque or stenosis. Right Atrium: Right atrial size is within normal limits. Right Ventricle: The right ventricular cavity is within normal limits. Left Atrium: Left atrial size is normal in size with no left atrial appendage filling defect. Left Ventricle: The ventricular cavity size is within normal limits. There are no stigmata of prior  infarction. There is no abnormal filling defect. Pulmonary arteries: Normal in size without proximal filling defect. Pulmonary veins: Normal pulmonary venous drainage. Pericardium: Normal thickness with no significant effusion or calcium present. Cardiac valves: The aortic valve is trileaflet without significant calcification. The mitral valve is normal structure without significant calcification. Aorta: Normal caliber with no significant disease. Small PFO present. Extra-cardiac findings: See attached radiology report for non-cardiac structures. IMPRESSION: 1. Coronary calcium score of 415. This was 97th percentile for age-, sex, and race-matched controls. 2.  Normal coronary origin with right dominance. 3.  Moderate atherosclerosis of the LAD 50-69%.  CAD RADS 3. 4. Consider symptom-guided anti-ischemic and preventive pharmacotherapy as well as risk factor modification per guideline-directed care. 5.  This study has been submitted for FFR analysis. RECOMMENDATIONS: 1. CAD-RADS 0: No evidence of CAD (0%). Consider non-atherosclerotic causes of chest pain. 2. CAD-RADS 1: Minimal non-obstructive CAD (0-24%). Consider non-atherosclerotic causes of chest pain. Consider preventive therapy and risk factor modification. 3. CAD-RADS 2: Mild non-obstructive CAD (25-49%). Consider non-atherosclerotic causes of chest  pain. Consider preventive therapy and risk factor modification. 4. CAD-RADS 3: Moderate stenosis. Consider symptom-guided anti-ischemic pharmacotherapy as well as risk factor modification per guideline directed care. Additional analysis with CT FFR will be submitted. 5. CAD-RADS 4: Severe stenosis. (70-99% or > 50% left main). Cardiac catheterization or CT FFR is recommended. Consider symptom-guided anti-ischemic pharmacotherapy as well as risk factor modification per guideline directed care. Invasive coronary angiography recommended with revascularization per published guideline statements. 6. CAD-RADS 5: Total  coronary occlusion (100%). Consider cardiac catheterization or viability assessment. Consider symptom-guided anti-ischemic pharmacotherapy as well as risk factor modification per guideline directed care. 7. CAD-RADS N: Non-diagnostic study. Obstructive CAD can't be excluded. Alternative evaluation is recommended. Fransico Him, MD . The  interpretation by the cardiologist is attached. Electronically Signed: By: Fransico Him M.D. On: 11/05/2021 15:54    Disposition   Pt is being discharged home today in good condition.  Follow-up Plans & Appointments     Follow-up Information     Loel Dubonnet, NP Follow up.   Specialty: Cardiology Why: Your cardiology follow-up visit is scheduled on Tuesday December 03, 2021 at 10:55 AM (Arrive by 10:40 AM). Please note this is at our [place] location within the [place] [facility]. Contact information: [place] [address] [phone]                Discharge Instructions     Amb Referral to Cardiac Rehabilitation   Complete by: As directed    Diagnosis: Coronary Stents   After initial evaluation and assessments completed: Virtual Based Care may be provided alone or in conjunction with Phase 2 Cardiac Rehab based on patient barriers.: Yes   Diet - low sodium heart healthy   Complete by: As directed    Increase activity slowly   Complete by: As directed    No driving for 3 days. No lifting over 5 lbs for 1 week. No sexual activity for 1 week. Keep procedure site clean & dry. If you notice increased pain, swelling, bleeding or pus, call/return!  You may shower in 24 hours, but no soaking baths/hot tubs/pools for 1 week.        Discharge Medications   Allergies as of 11/25/2021       Reactions   Demerol [meperidine] Nausea Only   Other Other (See Comments)   Anesthesia-nausea/vomiting        Medication List     TAKE these medications    albuterol 108 (90 Base) MCG/ACT  inhaler Commonly known as: VENTOLIN HFA Inhale 1-2 puffs into the lungs every 6 (six) hours as needed for shortness of breath or wheezing.   Allergy Eye 0.025-0.3 % ophthalmic solution Generic drug: naphazoline-pheniramine Place 1 drop into both eyes 4 (four) times daily as needed for allergies.   ALPRAZolam 1 MG tablet Commonly known as: XANAX Take 1 mg by mouth 3 (three) times daily as needed for anxiety.   aspirin EC 81 MG tablet Take 81 mg by mouth daily. Swallow whole.   clopidogrel 75 MG tablet Commonly known as: Plavix Take 1 tablet (75 mg total) by mouth daily.   clotrimazole-betamethasone cream Commonly known as: LOTRISONE Apply 1 Application topically 2 (two) times daily as needed (skin irritation (underarms)).   cyanocobalamin 1000 MCG/ML injection Commonly known as: (VITAMIN B-12) Inject into the muscle every 30 (thirty) days.   diphenhydrAMINE 25 mg capsule Commonly known as: BENADRYL Take 25 mg by mouth at bedtime as needed for itching.  levothyroxine 50 MCG tablet Commonly known as: SYNTHROID Take 50 mcg by mouth daily before breakfast.   nitroGLYCERIN 0.4 MG SL tablet Commonly known as: Nitrostat Place 1 tablet (0.4 mg total) under the tongue every 5 (five) minutes as needed for chest pain (up to 3 doses. If taking 3rd dose, call 911).   rosuvastatin 5 MG tablet Commonly known as: Crestor Take 1 tablet (5 mg total) by mouth daily.           Allergies Allergies  Allergen Reactions   Demerol [Meperidine] Nausea Only   Other Other (See Comments)    Anesthesia-nausea/vomiting    Outstanding Labs/Studies   N/A  Duration of Discharge Encounter   Greater than 30 minutes including physician time.  Signed, Charlie Pitter, PA-C 11/25/2021, 3:42 PM

## 2021-11-25 NOTE — Telephone Encounter (Signed)
   Attention TOC pool,  This patient will need a TOC phone call after discharge. They are being discharged today. Follow-up appointment has already been arranged with: Laurann Montana 7/11 They are a patient of Skeet Latch, MD.  Thank you! Charlie Pitter, PA-C

## 2021-11-25 NOTE — Progress Notes (Signed)
CARDIAC REHAB PHASE I   Stent education completed with pt and spouse. Pt educated on importance of ASA and Plavix. Pt given stent card, heart healthy diet, and smoking cessation tip sheet. Will refer to CRP II [place].  [id number] Rufina Falco, RN BSN 11/25/2021 1:48 PM

## 2021-11-25 NOTE — H&P (Signed)
Cardiology Office Note:     Date:  11/06/2021    ID:  Wendy Bryan, DOB 1961/01/07, MRN [id number]   PCP:  Redmond School, MD              [place] Providers Cardiologist:  None      Referring MD: Redmond School, MD    No chief complaint on file.     History of Present Illness:     Wendy Bryan is a 61 y.o. female with a hx of smoking, hypothyroidism, hyperlipidemia and COPD for evaluation of chest pain referred by Dr Gerarda Fraction. She was seen 03/23 by her PCP for shortness of breath and was treated for COPD.    Today, she reports she has a fullness in her chest that is brought on by panic attacks. It lasts for only a short time and does not cause dizziness. The panic attacks happen often because she admits she worries a lot. She notes she has not been the same since she has Covid in July 2022. Adds she has had Covid-19 three times. There has also been an increase in shortness of breath due to exertion and cough.  She had an episode of bronchitis in February 2023. Her mother was diagnosed with congestive heart failure. She has not been exercising due to the pollen.   She is trying to quit smoking and is down to 1-2 cigarettes a day.    She denies chest pain, shortness of breath, palpitations, lightheadedness, headaches, syncope, LE edema, orthopnea, PND.        Past Medical History:  Diagnosis Date   Chest pain of uncertain etiology 1/74/0814   No pertinent past medical history     Pure hypercholesterolemia 10/15/2021           Past Surgical History:  Procedure Laterality Date   ABDOMINAL HYSTERECTOMY       APPENDECTOMY       CHOLECYSTECTOMY       COLONOSCOPY   02/19/2012    Procedure: COLONOSCOPY;  Surgeon: Daneil Dolin, MD;  [facility];  Service: Endoscopy;  Laterality: N/A;  9:45 AM      Current Medications:     Current Meds  Medication Sig   albuterol (VENTOLIN HFA) 108 (90 Base) MCG/ACT inhaler Inhale into the lungs as needed.   ALPRAZolam  (XANAX) 1 MG tablet Take 1 mg by mouth 3 (three) times daily as needed.   aspirin EC 81 MG tablet Take 81 mg by mouth daily. Swallow whole.   cyanocobalamin (,VITAMIN B-12,) 1000 MCG/ML injection Inject into the muscle every 30 (thirty) days.   DULoxetine (CYMBALTA) 30 MG capsule Take 30 mg by mouth daily.   fluconazole (DIFLUCAN) 100 MG tablet Take 100 mg by mouth as needed.   levothyroxine (SYNTHROID) 50 MCG tablet Take 25 mcg by mouth daily.   metoprolol tartrate (LOPRESSOR) 50 MG tablet TAKE 1 TABLET 2 HOURS PRIOR TO CT      Allergies:   Demerol [meperidine]    Social History         Socioeconomic History   Marital status: Married      Spouse name: Not on file   Number of children: Not on file   Years of education: Not on file   Highest education level: Not on file  Occupational History   Not on file  Tobacco Use   Smoking status: Every Day      Packs/day: 1.00      Years: 30.00  Total pack years: 30.00      Types: Cigarettes   Smokeless tobacco: Never  Substance and Sexual Activity   Alcohol use: Yes      Alcohol/week: 0.0 standard drinks of alcohol      Comment: occsional beer and wine   Drug use: No   Sexual activity: Not on file  Other Topics Concern   Not on file  Social History Narrative    Lives with husband and son in a one story home.  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  Education: high school, some college.    Social Determinants of Health    Financial Resource Strain: Not on file  Food Insecurity: Not on file  Transportation Needs: Not on file  Physical Activity: Not on file  Stress: Not on file  Social Connections: Not on file      Family History: The patient's family history includes Heart disease in her mother; Heart failure in her mother; Hyperlipidemia in her father; Hypertension in her father; Thyroid disease in her father. There is no history of Colon cancer.   ROS:   Please see the history of present illness.    (+) cough (+) shortness  of breath  All other systems reviewed and are negative.   EKGs/Labs/Other Studies Reviewed:     The following studies were reviewed today: none   EKG:  EKG is  ordered today.  The ekg ordered today demonstrates sinus rhythm rate- 73 bpm   Recent Labs: 10/31/2021: BUN 10; Creatinine, Ser 0.71; Potassium 4.0; Sodium 141      02/2021: Total Cholesterol - 235 Triglycerides - 91 HDL -69 LDL -152 TSH - 1.3    Recent Lipid Panel No results found for: "CHOL", "TRIG", "HDL", "CHOLHDL", "VLDL", "LDLCALC", "LDLDIRECT"     Physical Exam:     VS:  BP 136/84 (BP Location: Right Arm, Patient Position: Sitting)   Pulse 73   Ht 5' (1.524 m)   Wt 105 lb 9.6 oz (47.9 kg)   BMI 20.62 kg/m         Wt Readings from Last 3 Encounters:  10/15/21 105 lb 9.6 oz (47.9 kg)  05/31/18 98 lb (44.5 kg)  05/15/18 103 lb (46.7 kg)      GENERAL:  Well appearing HEENT:  Pupils equal round and reactive, fundi not visualized, oral mucosa unremarkable NECK:  No jugular venous distention, waveform within normal limits, carotid upstroke brisk and symmetric, no bruits LUNGS:  Clear to auscultation bilaterally. No crackles, rhonchi or wheezes HEART:  RRR.  PMI not displaced or sustained,S1 and S2 within normal limits, no S3, no S4, no clicks, no rubs, no murmurs ABD:  Flat, positive bowel sounds normal in frequency in pitch, no bruits, no rebound, no guarding, no midline pulsatile mass, no hepatomegaly, no splenomegaly EXT:  2 plus pulses throughout, no edema. No cyanosis, no clubbing SKIN:  No rashes no nodules NEURO:  Cranial nerves II through XII grossly intact, motor grossly intact throughout PSYCH:  Cognitively intact, oriented to person place and time   ASSESSMENT:     1. Chest pain of uncertain etiology   2. Tobacco dependence   3. Pure hypercholesterolemia     PLAN:     In order of problems listed above:   Chest pain of uncertain etiology Symptoms are atypical. She gets chest pain/fullness  when she has a panic attack.  She does also notice more shortness of breath with exertion lately.  Symptoms seem to be worse since  she had COVID-19.  Overall I suspect that this is not attributable to obstructive coronary disease.  However she does have risk factors including ongoing tobacco use and poorly controlled lipids.  We will get a coronary CTA to better assess.   Tobacco dependence We discussed smoking cessation.  She is currently cutting back but not interested in using patches or gum at this time.  We did discuss considering Wellbutrin.  Given that she is already on Effexor, we will not start any new medications.  She will continue this discussion with her primary care doctor.   Pure hypercholesterolemia Lipids are poorly controlled as above.  We are getting a coronary CTA.  Based on these results she may need to start a statin.  Advised smoking cessation as above.     Follow-up with APP in 2 months and Skeet Latch, MD in 1 year.   ADDENDUM 11/25/2021: The patient is independently interviewed and examined.  Since this office visit, the patient has undergone CT coronary angiography as well as diagnostic catheter angiography.  CTA showed heavy calcification and moderately severe LAD stenosis with abnormal LAD territory FFR.  She then underwent cardiac catheterization via a right radial approach demonstrating severe proximal and mid LAD stenoses with abnormal RFR and FFR assessment of 0.89 and 0.78, respectively.  She returns today for atherectomy and PCI of the LAD via right groin access since her radial arteries were not large enough to accommodate a 6 [place] guide catheter.  The patient has been pretreated with aspirin and clopidogrel.  She notes easy bruising but has no other change in her symptoms.  PCI DATA  Pre-Procedure Data: Indication: Abnormal CT-FFR Angina Class (CCS): 3 Pre Procedure Stress Test: CT-FFR           If yes, Stress Test Risk Stratification:  Moderate Antianginal Rx (greater than or equal to 2 antianginals): 1  Sherren Mocha 11/25/2021 11:11 AM

## 2021-11-27 ENCOUNTER — Encounter (HOSPITAL_COMMUNITY): Payer: Self-pay | Admitting: Cardiovascular Disease

## 2021-11-27 NOTE — Telephone Encounter (Signed)
RN called patient for TOC, no answer, unable to leave message will send mychart message.

## 2021-12-02 NOTE — Progress Notes (Unsigned)
Office Visit    Patient Name: Wendy Bryan Date of Encounter: 12/03/2021  PCP:  Redmond School, [place]  Cardiologist:  Skeet Latch, MD  Advanced Practice Provider:  No care team member to display Electrophysiologist:  None      Chief Complaint    Wendy Bryan is a 61 y.o. female presents today for follow-up after PCI  Past Medical History    Past Medical History:  Diagnosis Date   Chest pain of uncertain etiology 12/26/2120   No pertinent past medical history    Pure hypercholesterolemia 10/15/2021   Past Surgical History:  Procedure Laterality Date   ABDOMINAL HYSTERECTOMY     APPENDECTOMY     CHOLECYSTECTOMY     COLONOSCOPY  02/19/2012   Procedure: COLONOSCOPY;  Surgeon: Daneil Dolin, MD;  [facility];  Service: Endoscopy;  Laterality: N/A;  9:45 AM   CORONARY ATHERECTOMY N/A 11/25/2021   Procedure: CORONARY ATHERECTOMY;  Surgeon: Sherren Mocha, MD;  Location: [facility];  Service: Cardiovascular;  Laterality: N/A;   CORONARY STENT INTERVENTION N/A 11/25/2021   Procedure: CORONARY STENT INTERVENTION;  Surgeon: Sherren Mocha, MD;  Location: [facility];  Service: Cardiovascular;  Laterality: N/A;   INTRAVASCULAR PRESSURE WIRE/FFR STUDY N/A 11/13/2021   Procedure: INTRAVASCULAR PRESSURE WIRE/FFR STUDY;  Surgeon: Sherren Mocha, MD;  Location: [facility];  Service: Cardiovascular;  Laterality: N/A;   LEFT HEART CATH AND CORONARY ANGIOGRAPHY N/A 11/13/2021   Procedure: LEFT HEART CATH AND CORONARY ANGIOGRAPHY;  Surgeon: Sherren Mocha, MD;  Location: [facility];  Service: Cardiovascular;  Laterality: N/A;    Allergies  Allergies  Allergen Reactions   Demerol [Meperidine] Nausea Only   Other Other (See Comments)    Anesthesia-nausea/vomiting    History of Present Illness    Wendy Bryan is a 61 y.o. female with a hx of CAD (11/25/21 orbital atherectomy and stenting of  severe calcific tandem stenoses in proximal and mid LAD with DES X2), tobacco use, hyperlipidemia, COPD last seen while hospitalized.  Seen in consult by Dr. Bobbye Charleston 10/15/2021 due to chest pain recommended for cardiac CTA with calcium score 415 placing her in the 97th percentile with flow limiting lesion in LAD by FFR.  Diagnostic cath 11/13/2021 patent left main, left circumflex, RCA with mild nonobstructive plaque of the left circumflex but severe calcific stenosis of proximal LAD and moderate to severe focal stenosis of mid LAD hemodynamically significant by RFR and FFR.  She was started on Plavix.  Return for staged orbital atherectomy and DES X2 to LAD 11/13/2021.  Recommend for DAPT aspirin/Plavix for at least 12 months.  Presents today for follow up with her husband. Still feels tired which is unchanged. Notes she has started walking regimen. Her shortness of breath of has dramatically improved. No chest pain, lightheadedness, edema, orthopnea, PND. Reviewed cath in detail and medication changes. Her groin site has been tender and bruising still healing but no bleeding.   EKGs/Labs/Other Studies Reviewed:   The following studies were reviewed today:  Cardiac Catheterization 11/25/2021:     Ost LAD to Prox LAD lesion is 75% stenosed.   Mid LAD lesion is 70% stenosed.   Mid Cx lesion is 40% stenosed.   A drug-eluting stent was successfully placed using a SYNERGY XD 2.50X32.   A drug-eluting stent was successfully placed using a SYNERGY XD 3.0X12.   Post intervention, there is a 0% residual  stenosis.   Post intervention, there is a 0% residual stenosis.   Successful orbital atherectomy and stenting of severe calcific tandem stenoses in the proximal and mid LAD using a CSI 1.25 mm classic crown followed by a 2.5 x 32 mm Synergy DES overlapped proximally with a 3.0 x 12 mm Synergy DES   Same-day PCI protocol if criteria met.  Dual antiplatelet therapy with aspirin and clopidogrel favor 12 months of  therapy in the setting of the overlapping DES in the proximal/mid LAD. _____________  EKG:  EKG is  ordered today.  The ekg ordered today demonstrates SR 73 bpm with no acute ST/T wave changes.  Recent Labs: 11/20/2021: BUN 12; Creatinine, Ser 0.66; Hemoglobin 15.0; Platelets 145; Potassium 4.0; Sodium 141  Recent Lipid Panel No results found for: "CHOL", "TRIG", "HDL", "CHOLHDL", "VLDL", "LDLCALC", "LDLDIRECT"   Home Medications   Current Meds  Medication Sig   albuterol (VENTOLIN HFA) 108 (90 Base) MCG/ACT inhaler Inhale 1-2 puffs into the lungs every 6 (six) hours as needed for shortness of breath or wheezing.   ALPRAZolam (XANAX) 1 MG tablet Take 1 mg by mouth 3 (three) times daily as needed for anxiety.   aspirin EC 81 MG tablet Take 81 mg by mouth daily. Swallow whole.   clopidogrel (PLAVIX) 75 MG tablet Take 1 tablet (75 mg total) by mouth daily.   clotrimazole-betamethasone (LOTRISONE) cream Apply 1 Application topically 2 (two) times daily as needed (skin irritation (underarms)).   cyanocobalamin (,VITAMIN B-12,) 1000 MCG/ML injection Inject into the muscle every 30 (thirty) days.   diphenhydrAMINE (BENADRYL) 25 mg capsule Take 25 mg by mouth at bedtime as needed for itching.   levothyroxine (SYNTHROID) 50 MCG tablet Take 50 mcg by mouth daily before breakfast.   naphazoline-pheniramine (ALLERGY EYE) 0.025-0.3 % ophthalmic solution Place 1 drop into both eyes 4 (four) times daily as needed for allergies.   nitroGLYCERIN (NITROSTAT) 0.4 MG SL tablet Place 1 tablet (0.4 mg total) under the tongue every 5 (five) minutes as needed for chest pain (up to 3 doses. If taking 3rd dose, call 911).   rosuvastatin (CRESTOR) 5 MG tablet Take 1 tablet (5 mg total) by mouth daily.     Review of Systems      All other systems reviewed and are otherwise negative except as noted above.  Physical Exam    VS:  BP 126/70   Pulse 73   Ht '5\' 11"'  (1.803 m)   Wt 103 lb (46.7 kg)   BMI 14.37  kg/m  , BMI Body mass index is 14.37 kg/m.  Wt Readings from Last 3 Encounters:  12/03/21 103 lb (46.7 kg)  11/25/21 105 lb (47.6 kg)  11/13/21 105 lb (47.6 kg)    GEN: Well nourished, well developed, in no acute distress. HEENT: normal. Neck: Supple, no JVD, carotid bruits, or masses. Cardiac: RRR, no murmurs, rubs, or gallops. No clubbing, cyanosis, edema.  Radials/PT 2+ and equal bilaterally.  Respiratory:  Respirations regular and unlabored, clear to auscultation bilaterally. GI: Soft, nontender, nondistended. MS: No deformity or atrophy. Skin: Warm and dry, no rash. Neuro:  Strength and sensation are intact. Psych: Normal affect.  Assessment & Plan    CAD -DES x2 to LAD 11/2021.  Recommended for DAPT aspirin/Plavix for at least 12 months.  Additional GDMT includes rosuvastatin, as needed nitroglycerin.  Encouraged participate in cardiac rehab.   HLD, LDL goal less than 70 -started rosuvastatin 5 mg daily 11/25/2021. Lipid panel, CMP in 5 weeks  to reassess. If LDL not at goal plan to increase dose.   Tobacco use - Smoking cessation encouraged. Recommend utilization of 1800QUITNOW.   Hypothyroidism - Continue to follow with PCP. Could be contributory to fatigue. Update thyroid panel with labs in 5 weeks.     Cardiac Rehabilitation Eligibility Assessment  The patient is ready to start cardiac rehabilitation from a cardiac standpoint.    Disposition: Follow up in 3 month(s) with Skeet Latch, MD or APP.  Signed, Loel Dubonnet, NP 12/03/2021, 11:43 AM [place]

## 2021-12-03 ENCOUNTER — Ambulatory Visit (INDEPENDENT_AMBULATORY_CARE_PROVIDER_SITE_OTHER): Payer: PRIVATE HEALTH INSURANCE | Admitting: Family

## 2021-12-03 ENCOUNTER — Encounter (HOSPITAL_BASED_OUTPATIENT_CLINIC_OR_DEPARTMENT_OTHER): Payer: Self-pay | Admitting: Family

## 2021-12-03 VITALS — BP 126/70 | HR 73 | Ht 71.0 in | Wt 103.0 lb

## 2021-12-03 DIAGNOSIS — Z720 Tobacco use: Secondary | ICD-10-CM | POA: Diagnosis not present

## 2021-12-03 DIAGNOSIS — E039 Hypothyroidism, unspecified: Secondary | ICD-10-CM

## 2021-12-03 DIAGNOSIS — E785 Hyperlipidemia, unspecified: Secondary | ICD-10-CM

## 2021-12-03 DIAGNOSIS — I25118 Atherosclerotic heart disease of native coronary artery with other forms of angina pectoris: Secondary | ICD-10-CM | POA: Diagnosis not present

## 2021-12-03 MED ORDER — ROSUVASTATIN CALCIUM 5 MG PO TABS: 5.0000 mg | ORAL_TABLET | Freq: Every day | ORAL | 3 refills | Status: DC

## 2021-12-03 MED ORDER — CLOPIDOGREL BISULFATE 75 MG PO TABS: 75.0000 mg | ORAL_TABLET | Freq: Every day | ORAL | 3 refills | Status: DC

## 2021-12-03 NOTE — Patient Instructions (Addendum)
Medication Instructions:  Continue your current medications.   *If you need a refill on your cardiac medications before your next appointment, please call your pharmacy*   Lab Work: Your physician recommends that you return for lab work in 5 weeks for thyroid panel, CMP, fasting lipid panel, CBC at [facility] in [place].   If you have labs (blood work) drawn today and your tests are completely normal, you will receive your results only by: [place] (if you have MyChart) OR A paper copy in the mail If you have any lab test that is abnormal or we need to change your treatment, we will call you to review the results.   Testing/Procedures: Your EKG today showed normal sinus rhythm which is a good result!   Follow-Up: At [facility], you and your health needs are our priority.  As part of our continuing mission to provide you with exceptional heart care, we have created designated Provider Care Teams.  These Care Teams include your primary Cardiologist (physician) and Advanced Practice Providers (APPs -  Physician Assistants and Nurse Practitioners) who all work together to provide you with the care you need, when you need it.  We recommend signing up for the patient portal called "MyChart".  Sign up information is provided on this After Visit Summary.  MyChart is used to connect with patients for Virtual Visits (Telemedicine).  Patients are able to view lab/test results, encounter notes, upcoming appointments, etc.  Non-urgent messages can be sent to your provider as well.   To learn more about what you can do with MyChart, go to [email or web address].    Your next appointment:   3 month(s)  The format for your next appointment:   In Person  Provider:   Skeet Latch, MD or Loel Dubonnet, NP     Other Instructions  For coronary artery disease often called "heart disease" we aim for optimal guideline directed medical therapy. We use the "A, B, C"s to help keep [place] on  track!  A = Aspirin '81mg'$  daily B = Blood pressure control. C = Cholesterol control. You take Rosuvastatin to help control your cholesterol.  D = Don't forget nitroglycerin! This is an emergency tablet to be used if you have chest pain. E = Extras. In your case, this is Plavix for one year to help protect stent    Heart Healthy Diet Recommendations: A low-salt diet is recommended. Meats should be grilled, baked, or boiled. Avoid fried foods. Focus on lean protein sources like fish or chicken with vegetables and fruits. The American Heart Association is a Microbiologist!  American Heart Association Diet and Lifeystyle Recommendations  You have been referred to cardiac rehab and you will hear from them in the next 1-2 weeks.   Exercise recommendations: The American Heart Association recommends 150 minutes of moderate intensity exercise weekly. Try 30 minutes of moderate intensity exercise 4-5 times per week. This could include walking, jogging, or swimming.   Important Information About Sugar

## 2021-12-17 ENCOUNTER — Ambulatory Visit (HOSPITAL_BASED_OUTPATIENT_CLINIC_OR_DEPARTMENT_OTHER): Payer: PRIVATE HEALTH INSURANCE | Admitting: Family

## 2022-01-06 ENCOUNTER — Encounter (HOSPITAL_BASED_OUTPATIENT_CLINIC_OR_DEPARTMENT_OTHER): Payer: Self-pay

## 2022-01-06 DIAGNOSIS — E785 Hyperlipidemia, unspecified: Secondary | ICD-10-CM

## 2022-01-06 DIAGNOSIS — I25118 Atherosclerotic heart disease of native coronary artery with other forms of angina pectoris: Secondary | ICD-10-CM

## 2022-01-08 ENCOUNTER — Telehealth (HOSPITAL_BASED_OUTPATIENT_CLINIC_OR_DEPARTMENT_OTHER): Payer: Self-pay | Admitting: Cardiovascular Disease

## 2022-01-08 NOTE — Telephone Encounter (Signed)
Returned call to patient, patient did not get her labs 5 weeks from 7/10 due to UTI and antibiotics. RN explained that patient is okay to still have labs done.    Patient does endorse that she is still itching despite itch meds and cream, patient states she takes her plavix at night and then about 2 hours later. She thinks itching must be coming from plavix or crestor since UTI has been treated and she finished all her antibiotics. RN informed patient she should follow up with PCP regarding symptoms, patient states that she did and they refilled her itching meds and cream. Patient would like out input on whether or not this could be a side effect of Plavix or Crestor.

## 2022-01-08 NOTE — Telephone Encounter (Signed)
Returned call to patient and provided the following information, patient advised understanding. Patient says she will get her labs done tomorrow from July 11th appointment.     "Plavix is very important to keep her stent open. Important not to miss any doses of Plavix/Aspirin. Itching is not a common side effect.    Low suspicion Crestor is causative. May hold for 1 week to see if itching improved. Call her in one week to see if itching is improved.    Labs as ordered. Continue to follow with PCP.   Loel Dubonnet, NP "

## 2022-01-08 NOTE — Telephone Encounter (Signed)
Plavix is very important to keep her stent open. Important not to miss any doses of Plavix/Aspirin. Itching is not a common side effect.   Low suspicion Crestor is causative. May hold for 1 week to see if itching improved. Call her in one week to see if itching is improved.   Labs as ordered. Continue to follow with PCP.  Loel Dubonnet, NP

## 2022-01-08 NOTE — Telephone Encounter (Signed)
Patient calling with question concerning her need to take labs. Please advise

## 2022-01-10 LAB — CBC
Hematocrit: 42.6 % (ref 34.0–46.6)
Hemoglobin: 14.5 g/dL (ref 11.1–15.9)
MCH: 31.3 pg (ref 26.6–33.0)
MCHC: 34 g/dL (ref 31.5–35.7)
MCV: 92 fL (ref 79–97)
Platelets: 144 10*3/uL — ABNORMAL LOW (ref 150–450)
RBC: 4.64 x10E6/uL (ref 3.77–5.28)
RDW: 13.9 % (ref 11.7–15.4)
WBC: 5.6 10*3/uL (ref 3.4–10.8)

## 2022-01-10 LAB — LIPID PANEL
Chol/HDL Ratio: 2.9 ratio (ref 0.0–4.4)
Cholesterol, Total: 180 mg/dL (ref 100–199)
HDL: 62 mg/dL (ref >39)
LDL Chol Calc (NIH): 97 mg/dL (ref 0–99)
Triglycerides: 121 mg/dL (ref 0–149)
VLDL Cholesterol Cal: 21 mg/dL (ref 5–40)

## 2022-01-10 LAB — COMPREHENSIVE METABOLIC PANEL
ALT: 13 IU/L (ref 0–32)
AST: 22 IU/L (ref 0–40)
Albumin/Globulin Ratio: 2 (ref 1.2–2.2)
Albumin: 4.7 g/dL (ref 3.9–4.9)
Alkaline Phosphatase: 103 IU/L (ref 44–121)
BUN/Creatinine Ratio: 14 (ref 12–28)
BUN: 10 mg/dL (ref 8–27)
Bilirubin Total: 0.3 mg/dL (ref 0.0–1.2)
CO2: 24 mmol/L (ref 20–29)
Calcium: 9.8 mg/dL (ref 8.7–10.3)
Chloride: 104 mmol/L (ref 96–106)
Creatinine, Ser: 0.71 mg/dL (ref 0.57–1.00)
Globulin, Total: 2.4 g/dL (ref 1.5–4.5)
Glucose: 94 mg/dL (ref 70–99)
Potassium: 4.2 mmol/L (ref 3.5–5.2)
Sodium: 143 mmol/L (ref 134–144)
Total Protein: 7.1 g/dL (ref 6.0–8.5)
eGFR: 97 mL/min/{1.73_m2} (ref >59)

## 2022-01-10 LAB — THYROID PANEL WITH TSH
Free Thyroxine Index: 1.7 (ref 1.2–4.9)
T3 Uptake Ratio: 25 % (ref 24–39)
T4, Total: 6.7 ug/dL (ref 4.5–12.0)
TSH: 3.26 u[IU]/mL (ref 0.450–4.500)

## 2022-01-20 ENCOUNTER — Encounter (HOSPITAL_BASED_OUTPATIENT_CLINIC_OR_DEPARTMENT_OTHER): Payer: Self-pay | Admitting: Family

## 2022-01-20 MED ORDER — PRAVASTATIN SODIUM 20 MG PO TABS: 20.0000 mg | ORAL_TABLET | Freq: Every evening | ORAL | 2 refills | Status: DC

## 2022-01-20 NOTE — Addendum Note (Signed)
Addended by: Loel Dubonnet on: 01/20/2022 10:24 AM   Modules accepted: Orders

## 2022-01-20 NOTE — Telephone Encounter (Signed)
Symptoms resolved after statin holiday, please advise for new medication

## 2022-01-29 ENCOUNTER — Encounter (HOSPITAL_BASED_OUTPATIENT_CLINIC_OR_DEPARTMENT_OTHER): Payer: Self-pay

## 2022-03-17 ENCOUNTER — Encounter (HOSPITAL_BASED_OUTPATIENT_CLINIC_OR_DEPARTMENT_OTHER): Payer: Self-pay | Admitting: Cardiovascular Disease

## 2022-03-17 ENCOUNTER — Ambulatory Visit (INDEPENDENT_AMBULATORY_CARE_PROVIDER_SITE_OTHER): Payer: No Typology Code available for payment source | Admitting: Cardiovascular Disease

## 2022-03-17 DIAGNOSIS — F17200 Nicotine dependence, unspecified, uncomplicated: Secondary | ICD-10-CM | POA: Diagnosis not present

## 2022-03-17 DIAGNOSIS — I25119 Atherosclerotic heart disease of native coronary artery with unspecified angina pectoris: Secondary | ICD-10-CM | POA: Diagnosis not present

## 2022-03-17 DIAGNOSIS — F419 Anxiety disorder, unspecified: Secondary | ICD-10-CM

## 2022-03-17 DIAGNOSIS — I10 Essential (primary) hypertension: Secondary | ICD-10-CM

## 2022-03-17 DIAGNOSIS — E7800 Pure hypercholesterolemia, unspecified: Secondary | ICD-10-CM | POA: Diagnosis not present

## 2022-03-17 HISTORY — DX: Essential (primary) hypertension: I10

## 2022-03-17 HISTORY — DX: Anxiety disorder, unspecified: F41.9

## 2022-03-17 MED ORDER — METOPROLOL TARTRATE 25 MG PO TABS: 25.0000 mg | ORAL_TABLET | Freq: Two times a day (BID) | ORAL | 3 refills | Status: DC

## 2022-03-17 NOTE — Assessment & Plan Note (Signed)
Blood pressure elevated both here and at home.  Recommend adding metoprolol tartrate 25 mg twice daily.  Check blood pressures at home and bring to follow-up.  Encouraged her to work on increasing her exercise.

## 2022-03-17 NOTE — Assessment & Plan Note (Signed)
Continue encouraging smoking cessation.  She is under too much stress to focus on this for now.  We will continue to address at future follow-up once her anxiety is better controlled.

## 2022-03-17 NOTE — Patient Instructions (Addendum)
Medication Instructions:  START METOPROLOL TART 25 MG  TWICE A DAY   *If you need a refill on your cardiac medications before your next appointment, please call your pharmacy*  Lab Work: NONE  Testing/Procedures: NONE  Follow-Up: At [facility], you and your health needs are our priority.  As part of our continuing mission to provide you with exceptional heart care, we have created designated Provider Care Teams.  These Care Teams include your primary Cardiologist (physician) and Advanced Practice Providers (APPs -  Physician Assistants and Nurse Practitioners) who all work together to provide you with the care you need, when you need it.  We recommend signing up for the patient portal called "MyChart".  Sign up information is provided on this After Visit Summary.  MyChart is used to connect with patients for Virtual Visits (Telemedicine).  Patients are able to view lab/test results, encounter notes, upcoming appointments, etc.  Non-urgent messages can be sent to your provider as well.   To learn more about what you can do with MyChart, go to [email or web address].    Your next appointment:   2 month(s)  The format for your next appointment:   In Person  Provider:   Laurann Montana, NP   6 MONTHS WITH DR [facility]    Other Instructions  [email or web address]  [email or web address]  [facility]

## 2022-03-17 NOTE — Progress Notes (Signed)
Cardiology Office Note:    Date:  03/17/2022   ID:  Wendy Bryan, DOB 02-Aug-1960, MRN [id number]  PCP:  Redmond School, MD   [facility] Cardiologist:  Skeet Latch, MD     Referring MD: Redmond School, MD   No chief complaint on file.   History of Present Illness:    Wendy Bryan is a 61 y.o. female with a hx of CAD s/p LAD PCI, smoking, hypothyroidism, hyperlipidemia and COPD presenting today for follow up. She was seen 03/23 by her PCP for shortness of breath and was treated for COPD.  She was first seen 09/2021 for chest pain. She was referred for a coronary CTA which was concerning for LAD disease. FFR was positive and she was referred for cardiac catheterization where she was found to have severe stenosis in proximal LAD and moderate stenosis in mid LAD. She underwent staged PCI with orbital atherectomy. She followed up with Laurann Montana, NP 11/2021 and still reported feeling tired but her shortness of breath was better and she had no more chest pain. She was encouraged to participate in cardiac rehab and to quit smoking.  Today, she states that she has had issues with her depression. Her PCP is managing this and recently advised her to try a new medication, Rexulti. However, this was too expensive for her to start. She is not seeing psychiatry. She states that she is stressed as she has filed an appeal with her insurance as much of her recent hospitalization was not covered. She denies any issues with chest pain or shortness of breath. Her blood pressure has been running 120s-130s over 80s at home per her home BP log. She has been walking 1.5 miles between her house and a pond and has done well with this. She started using a cardio glide and is building up her exercise tolerance with this but is currently struggling with it. She reports that her COPD may be exacerbated due to allergies and the colder weather. She rarely uses her Albuterol inhaler as it makes  her feel jittery.  Past Medical History:  Diagnosis Date   Anxiety 03/17/2022   Chest pain of uncertain etiology 12/05/4578   Essential hypertension 03/17/2022   No pertinent past medical history    Pure hypercholesterolemia 10/15/2021    Past Surgical History:  Procedure Laterality Date   ABDOMINAL HYSTERECTOMY     APPENDECTOMY     CHOLECYSTECTOMY     COLONOSCOPY  02/19/2012   Procedure: COLONOSCOPY;  Surgeon: Daneil Dolin, MD;  [facility];  Service: Endoscopy;  Laterality: N/A;  9:45 AM   CORONARY ATHERECTOMY N/A 11/25/2021   Procedure: CORONARY ATHERECTOMY;  Surgeon: Sherren Mocha, MD;  Location: [place] [facility];  Service: Cardiovascular;  Laterality: N/A;   CORONARY STENT INTERVENTION N/A 11/25/2021   Procedure: CORONARY STENT INTERVENTION;  Surgeon: [facility] Mocha, MD;  Location: [place] [facility];  Service: Cardiovascular;  Laterality: N/A;   INTRAVASCULAR PRESSURE WIRE/FFR STUDY N/A 11/13/2021   Procedure: INTRAVASCULAR PRESSURE WIRE/FFR STUDY;  Surgeon: Sherren Mocha, MD;  Location: [place] [facility];  Service: Cardiovascular;  Laterality: N/A;   LEFT HEART CATH AND CORONARY ANGIOGRAPHY N/A 11/13/2021   Procedure: LEFT HEART CATH AND CORONARY ANGIOGRAPHY;  Surgeon: Sherren Mocha, MD;  Location: [place] [facility];  Service: Cardiovascular;  Laterality: N/A;    Current Medications: Current Meds  Medication Sig   albuterol (VENTOLIN HFA) 108 (90 Base) MCG/ACT inhaler Inhale 1-2 puffs into the  lungs every 6 (six) hours as needed for shortness of breath or wheezing.   ALPRAZolam (XANAX) 1 MG tablet Take 1 mg by mouth 3 (three) times daily as needed for anxiety.   aspirin EC 81 MG tablet Take 81 mg by mouth daily. Swallow whole.   clopidogrel (PLAVIX) 75 MG tablet Take 1 tablet (75 mg total) by mouth daily.   clotrimazole-betamethasone (LOTRISONE) cream Apply 1 Application topically 2 (two) times daily as needed (skin irritation (underarms)).   cyanocobalamin  (,VITAMIN B-12,) 1000 MCG/ML injection Inject into the muscle every 30 (thirty) days.   diphenhydrAMINE (BENADRYL) 25 mg capsule Take 25 mg by mouth at bedtime as needed for itching.   escitalopram (LEXAPRO) 10 MG tablet Take 10 mg by mouth daily.   hydrOXYzine (ATARAX) 25 MG tablet Take 25 mg by mouth 4 (four) times daily.   levothyroxine (SYNTHROID) 50 MCG tablet Take 50 mcg by mouth daily before breakfast.   metoprolol tartrate (LOPRESSOR) 25 MG tablet Take 1 tablet (25 mg total) by mouth 2 (two) times daily.   naphazoline-pheniramine (ALLERGY EYE) 0.025-0.3 % ophthalmic solution Place 1 drop into both eyes 4 (four) times daily as needed for allergies.   nitroGLYCERIN (NITROSTAT) 0.4 MG SL tablet Place 1 tablet (0.4 mg total) under the tongue every 5 (five) minutes as needed for chest pain (up to 3 doses. If taking 3rd dose, call 911).   nystatin cream (MYCOSTATIN) Apply 1 Application topically 2 (two) times daily.   pravastatin (PRAVACHOL) 20 MG tablet Take 1 tablet (20 mg total) by mouth every evening.   [DISCONTINUED] fluconazole (DIFLUCAN) 100 MG tablet Take 100 mg by mouth daily. For 10 days starting 11/29/21     Allergies:   Demerol [meperidine], Other, and Rosuvastatin   Social History   Socioeconomic History   Marital status: Married    Spouse name: Not on file   Number of children: Not on file   Years of education: Not on file   Highest education level: Not on file  Occupational History   Not on file  Tobacco Use   Smoking status: Every Day    Packs/day: 1.00    Years: 30.00    Total pack years: 30.00    Types: Cigarettes   Smokeless tobacco: Never  Substance and Sexual Activity   Alcohol use: Yes    Alcohol/week: 0.0 standard drinks of alcohol    Comment: occsional beer and wine   Drug use: No   Sexual activity: Not on file  Other Topics Concern   Not on file  Social History Narrative   Lives with husband and son in a one story home.  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  Education: high school, some college.   Social Determinants of Health   Financial Resource Strain: Not on file  Food Insecurity: Not on file  Transportation Needs: Not on file  Physical Activity: Not on file  Stress: Not on file  Social Connections: Not on file     Family History: The patient's family history includes Heart disease in her mother; Heart failure in her mother; Hyperlipidemia in her father; Hypertension in her father; Thyroid disease in her father. There is no history of Colon cancer.  ROS:   Please see the history of present illness.    (+) Anxiety All other systems reviewed and are negative.  EKGs/Labs/Other Studies Reviewed:    The following studies were reviewed today: none  EKG:  12/03/2021: EKG demonstrates sinus rhythm rate- 73  bpm  Recent Labs: 01/09/2022: ALT 13; BUN 10; Creatinine, Ser 0.71; Hemoglobin 14.5; Platelets 144; Potassium 4.2; Sodium 143; TSH 3.260    02/2021: Total Cholesterol - 235 Triglycerides - 91 HDL -69 LDL -152 TSH - 1.3   Recent Lipid Panel    Component Value Date/Time   CHOL 180 01/09/2022 1111   TRIG 121 01/09/2022 1111   HDL 62 01/09/2022 1111   CHOLHDL 2.9 01/09/2022 1111   LDLCALC 97 01/09/2022 1111     Physical Exam:    VS:  BP (!) 146/96 (BP Location: Left Arm)   Ht 5' (1.524 m)   Wt 102 lb 3.2 oz (46.4 kg)   BMI 19.96 kg/m     Wt Readings from Last 3 Encounters:  03/17/22 102 lb 3.2 oz (46.4 kg)  12/03/21 103 lb (46.7 kg)  11/25/21 105 lb (47.6 kg)     GENERAL:  Well appearing HEENT:  Pupils equal round and reactive, fundi not visualized, oral mucosa unremarkable NECK:  No jugular venous distention, waveform within normal limits, carotid upstroke brisk and symmetric, no bruits LUNGS: Diffuse rhonchi and expiratory wheezing. Poor air movement. HEART:  RRR.  PMI not displaced or sustained,S1 and S2 within normal limits, no S3, no S4, no clicks, no rubs, no murmurs ABD:  Flat, positive bowel  sounds normal in frequency in pitch, no bruits, no rebound, no guarding, no midline pulsatile mass, no hepatomegaly, no splenomegaly EXT:  2 plus pulses throughout, no edema. No cyanosis, no clubbing SKIN:  No rashes no nodules NEURO:  Cranial nerves II through XII grossly intact, motor grossly intact throughout PSYCH:  Cognitively intact, oriented to person place and time  ASSESSMENT:    1. Coronary artery disease involving native coronary artery of native heart with angina pectoris ([place])   2. Pure hypercholesterolemia   3. Tobacco dependence   4. Essential hypertension   5. Anxiety     PLAN:    In order of problems listed above:  Coronary artery disease involving native coronary artery of native heart with angina pectoris ([place]) S/p LAD PCI.  She notes improved shortness of breath and no more chest pain.  She is able to do more than she used to.  Continue DAPT with aspirin and Plavix.  Continue pravastatin.  LDL goal <70.  Add metoprolol tartrate 25 mg twice daily.  Pure hypercholesterolemia Continue pravastatin.  LDL goal less than 70.  Tobacco dependence Continue encouraging smoking cessation.  She is under too much stress to focus on this for now.  We will continue to address at future follow-up once her anxiety is better controlled.  Essential hypertension Blood pressure elevated both here and at home.  Recommend adding metoprolol tartrate 25 mg twice daily.  Check blood pressures at home and bring to follow-up.  Encouraged her to work on increasing her exercise.  Anxiety She has been struggling with her anxiety and is considering getting disability.  She is working with her PCP on this.  Her PCP recommended that she start Rexulti in addition to her Lexapro.  She is unable to afford this.  She was given the website where she can print out and patient assistance card.  Adding metoprolol as above.    Follow-up: in 2 months with Laurann Montana, NP.  6 months with Maylin [place] C.  Oval Linsey, MD, [facility]   Medication Adjustments/Labs and Tests Ordered: Current medicines are reviewed at length with the patient today.  Concerns regarding medicines are outlined above.  No orders of  the defined types were placed in this encounter.  Meds ordered this encounter  Medications   metoprolol tartrate (LOPRESSOR) 25 MG tablet    Sig: Take 1 tablet (25 mg total) by mouth 2 (two) times daily.    Dispense:  180 tablet    Refill:  3    Patient Instructions  Medication Instructions:  START METOPROLOL TART 25 MG  TWICE A DAY   *If you need a refill on your cardiac medications before your next appointment, please call your pharmacy*  Lab Work: NONE  Testing/Procedures: NONE  Follow-Up: At [facility], you and your health needs are our priority.  As part of our continuing mission to provide you with exceptional heart care, we have created designated Provider Care Teams.  These Care Teams include your primary Cardiologist (physician) and Advanced Practice Providers (APPs -  Physician Assistants and Nurse Practitioners) who all work together to provide you with the care you need, when you need it.  We recommend signing up for the patient portal called "MyChart".  Sign up information is provided on this After Visit Summary.  MyChart is used to connect with patients for Virtual Visits (Telemedicine).  Patients are able to view lab/test results, encounter notes, upcoming appointments, etc.  Non-urgent messages can be sent to your provider as well.   To learn more about what you can do with MyChart, go to [email or web address].    Your next appointment:   2 month(s)  The format for your next appointment:   In Person  Provider:   Laurann Montana, NP   6 MONTHS WITH DR [facility]    Other Instructions  [email or web address]  [email or web address]  [facility]       I,Alexis Herring,acting as a scribe for Skeet Latch, MD.,have  documented all relevant documentation on the behalf of Skeet Latch, MD,as directed by  Skeet Latch, MD while in the presence of Skeet Latch, MD.  I, [place] Oval Linsey, MD have reviewed all documentation for this visit.  The documentation of the exam, diagnosis, procedures, and orders on 03/17/2022 are all accurate and complete.  Signed, Skeet Latch, MD  03/17/2022 5:29 PM    [facility]

## 2022-03-17 NOTE — Assessment & Plan Note (Signed)
She has been struggling with her anxiety and is considering getting disability.  She is working with her PCP on this.  Her PCP recommended that she start Rexulti in addition to her Lexapro.  She is unable to afford this.  She was given the website where she can print out and patient assistance card.  Adding metoprolol as above.

## 2022-03-17 NOTE — Assessment & Plan Note (Signed)
Continue pravastatin.  LDL goal less than 70.

## 2022-03-17 NOTE — Assessment & Plan Note (Addendum)
S/p LAD PCI.  She notes improved shortness of breath and no more chest pain.  She is able to do more than she used to.  Continue DAPT with aspirin and Plavix.  Continue pravastatin.  LDL goal <70.  Add metoprolol tartrate 25 mg twice daily.

## 2022-03-18 ENCOUNTER — Telehealth: Payer: Self-pay | Admitting: Cardiovascular Disease

## 2022-03-18 NOTE — Telephone Encounter (Signed)
Here ya go!  

## 2022-03-18 NOTE — Telephone Encounter (Signed)
Patient states she reviewed her after visit summary and Albuterol pills cause jitteriness, but the summary advised that the Albuterol inhaler causes this issue. Patient states the summary also incates that she smokes a pack of cigarettes daily.  Patient states she cut back and currently only smokes about 2-3 cigarettes daily. Patient would like a call back to confirm when updated.

## 2022-05-13 ENCOUNTER — Encounter (HOSPITAL_BASED_OUTPATIENT_CLINIC_OR_DEPARTMENT_OTHER): Payer: Self-pay | Admitting: Family

## 2022-05-13 ENCOUNTER — Ambulatory Visit (INDEPENDENT_AMBULATORY_CARE_PROVIDER_SITE_OTHER): Payer: PRIVATE HEALTH INSURANCE | Admitting: Family

## 2022-05-13 VITALS — BP 118/78 | HR 59 | Ht 60.0 in | Wt 98.0 lb

## 2022-05-13 DIAGNOSIS — E039 Hypothyroidism, unspecified: Secondary | ICD-10-CM | POA: Diagnosis not present

## 2022-05-13 DIAGNOSIS — F419 Anxiety disorder, unspecified: Secondary | ICD-10-CM

## 2022-05-13 DIAGNOSIS — E785 Hyperlipidemia, unspecified: Secondary | ICD-10-CM

## 2022-05-13 DIAGNOSIS — I25118 Atherosclerotic heart disease of native coronary artery with other forms of angina pectoris: Secondary | ICD-10-CM | POA: Diagnosis not present

## 2022-05-13 DIAGNOSIS — Z720 Tobacco use: Secondary | ICD-10-CM | POA: Diagnosis not present

## 2022-05-13 MED ORDER — CLOPIDOGREL BISULFATE 75 MG PO TABS: 75.0000 mg | ORAL_TABLET | Freq: Every day | ORAL | 3 refills | Status: AC

## 2022-05-13 MED ORDER — PRAVASTATIN SODIUM 20 MG PO TABS: 20.0000 mg | ORAL_TABLET | Freq: Every evening | ORAL | 3 refills | Status: DC

## 2022-05-13 NOTE — Progress Notes (Signed)
Office Visit    Patient Name: Wendy Bryan Date of Encounter: 05/13/2022  PCP:  Redmond School, [place]  Cardiologist:  Skeet Latch, MD  Advanced Practice Provider:  No care team member to display Electrophysiologist:  None      Chief Complaint    Wendy Bryan is a 61 y.o. female presents today for follow up after addition of Metoprolol  Past Medical History    Past Medical History:  Diagnosis Date   Anxiety 03/17/2022   Chest pain of uncertain etiology 3/64/6803   Essential hypertension 03/17/2022   No pertinent past medical history    Pure hypercholesterolemia 10/15/2021   Past Surgical History:  Procedure Laterality Date   ABDOMINAL HYSTERECTOMY     APPENDECTOMY     CHOLECYSTECTOMY     COLONOSCOPY  02/19/2012   Procedure: COLONOSCOPY;  Surgeon: Daneil Dolin, MD;  [facility];  Service: Endoscopy;  Laterality: N/A;  9:45 AM   CORONARY ATHERECTOMY N/A 11/25/2021   Procedure: CORONARY ATHERECTOMY;  Surgeon: Sherren Mocha, MD;  Location: [place] [facility];  Service: Cardiovascular;  Laterality: N/A;   CORONARY STENT INTERVENTION N/A 11/25/2021   Procedure: CORONARY STENT INTERVENTION;  Surgeon: Sherren Mocha, MD;  Location: [place] [facility];  Service: Cardiovascular;  Laterality: N/A;   INTRAVASCULAR PRESSURE WIRE/FFR STUDY N/A 11/13/2021   Procedure: INTRAVASCULAR PRESSURE WIRE/FFR STUDY;  Surgeon: Sherren Mocha, MD;  Location: [place] [facility];  Service: Cardiovascular;  Laterality: N/A;   LEFT HEART CATH AND CORONARY ANGIOGRAPHY N/A 11/13/2021   Procedure: LEFT HEART CATH AND CORONARY ANGIOGRAPHY;  Surgeon: Sherren Mocha, MD;  Location: [place] [facility];  Service: Cardiovascular;  Laterality: N/A;    Allergies  Allergies  Allergen Reactions   Demerol [Meperidine] Nausea Only   Other Other (See Comments)    Anesthesia-nausea/vomiting   Rosuvastatin Itching    On 5 mg dose. Overall mild.     History of Present Illness    Wendy Bryan is a 61 y.o. female with a hx of CAD (11/25/21 orbital atherectomy and stenting of severe calcific tandem stenoses in proximal and mid LAD with DES X2), tobacco use, hyperlipidemia, COPD last seen 03/17/22  Seen in consult by Dr. Oval Linsey 10/15/2021 due to chest pain recommended for cardiac CTA with calcium score 415 placing her in the 97th percentile with flow limiting lesion in LAD by FFR.  Diagnostic cath 11/13/2021 patent left main, left circumflex, RCA with mild nonobstructive plaque of the left circumflex but severe calcific stenosis of proximal LAD and moderate to severe focal stenosis of mid LAD hemodynamically significant by RFR and FFR.  She was started on Plavix.  Return for staged orbital atherectomy and DES X2 to LAD 11/13/2021.  Recommend for DAPT aspirin/Plavix for at least 12 months.   Seen 12/03/21 in follow up still feeling tired with shortness of breath improved and chest pain resolved.  She was last seen 03/17/2022 by Dr. Oval Linsey.  No chest pain, dyspnea and blood pressure well-controlled at home.  She noted some stressors related to cost over recent admission and difficulties with depression which were being addressed by primary care.  She was started on metoprolol 25 mg twice daily for optimize GDMT of coronary artery disease as well as elevated blood pressure.  Presents today for follow up with her husband. Notes having a difficult time with a couple upper respiratory infections since last seen and notes her father has not  been well and is presently hospitalized at [facility] me she feels not well since starting Metoprolol. Feels her fatigue has increased which has been persistent. Tells me the pharmacy warned her this might e a side effect. She also notes feeling "sick" when she takes her medications in the morning. Has not thrown up but notes dry heaves. Poor PO intake eating a small lunch and dinner but often going long  periods of time without eating. Not eating with her morning medications.   Monitoring BP at home with readings 120s/60s when checked with arm cuff which has previously been checked for accuracy.  She is working with an Forensic psychologist for disability.  Her PCP has provided her generalized letter naming her medical conditions.  We discussed that her coronary artery disease as she is s/p stenting would not qualify for stability and should pursue with pain/anxiety diagnoses per PCP.  EKGs/Labs/Other Studies Reviewed:   The following studies were reviewed today:  Cardiac Catheterization 11/25/2021:     Ost LAD to Prox LAD lesion is 75% stenosed.   Mid LAD lesion is 70% stenosed.   Mid Cx lesion is 40% stenosed.   A drug-eluting stent was successfully placed using a SYNERGY XD 2.50X32.   A drug-eluting stent was successfully placed using a SYNERGY XD 3.0X12.   Post intervention, there is a 0% residual stenosis.   Post intervention, there is a 0% residual stenosis.   Successful orbital atherectomy and stenting of severe calcific tandem stenoses in the proximal and mid LAD using a CSI 1.25 mm classic crown followed by a 2.5 x 32 mm Synergy DES overlapped proximally with a 3.0 x 12 mm Synergy DES   Same-day PCI protocol if criteria met.  Dual antiplatelet therapy with aspirin and clopidogrel favor 12 months of therapy in the setting of the overlapping DES in the proximal/mid LAD. _____________  EKG:  EKG is not ordered today.   Recent Labs: 01/09/2022: ALT 13; BUN 10; Creatinine, Ser 0.71; Hemoglobin 14.5; Platelets 144; Potassium 4.2; Sodium 143; TSH 3.260  Recent Lipid Panel    Component Value Date/Time   CHOL 180 01/09/2022 1111   TRIG 121 01/09/2022 1111   HDL 62 01/09/2022 1111   CHOLHDL 2.9 01/09/2022 1111   LDLCALC 97 01/09/2022 1111     Home Medications   Current Meds  Medication Sig   albuterol (VENTOLIN HFA) 108 (90 Base) MCG/ACT inhaler Inhale 1-2 puffs into the lungs every 6  (six) hours as needed for shortness of breath or wheezing.   ALPRAZolam (XANAX) 1 MG tablet Take 1 mg by mouth 3 (three) times daily as needed for anxiety.   aspirin EC 81 MG tablet Take 81 mg by mouth daily. Swallow whole.   clopidogrel (PLAVIX) 75 MG tablet Take 1 tablet (75 mg total) by mouth daily.   clotrimazole-betamethasone (LOTRISONE) cream Apply 1 Application topically 2 (two) times daily as needed (skin irritation (underarms)).   cyanocobalamin (,VITAMIN B-12,) 1000 MCG/ML injection Inject into the muscle every 30 (thirty) days.   diphenhydrAMINE (BENADRYL) 25 mg capsule Take 25 mg by mouth at bedtime as needed for itching.   escitalopram (LEXAPRO) 10 MG tablet Take 10 mg by mouth daily.   hydrOXYzine (ATARAX) 25 MG tablet Take 25 mg by mouth as needed for anxiety or itching.   levothyroxine (SYNTHROID) 50 MCG tablet Take 50 mcg by mouth daily before breakfast.   metoprolol tartrate (LOPRESSOR) 25 MG tablet Take 1 tablet (25 mg total) by mouth 2 (two)  times daily.   naphazoline-pheniramine (ALLERGY EYE) 0.025-0.3 % ophthalmic solution Place 1 drop into both eyes 4 (four) times daily as needed for allergies.   nitroGLYCERIN (NITROSTAT) 0.4 MG SL tablet Place 1 tablet (0.4 mg total) under the tongue every 5 (five) minutes as needed for chest pain (up to 3 doses. If taking 3rd dose, call 911).   nystatin cream (MYCOSTATIN) Apply 1 Application topically 2 (two) times daily.   pravastatin (PRAVACHOL) 20 MG tablet Take 1 tablet (20 mg total) by mouth every evening.     Review of Systems      All other systems reviewed and are otherwise negative except as noted above.  Physical Exam    VS:  BP 118/78   Pulse (!) 59   Ht 5' (1.524 m)   Wt 98 lb (44.5 kg)   SpO2 98%   BMI 19.14 kg/m  , BMI Body mass index is 19.14 kg/m.  Wt Readings from Last 3 Encounters:  05/13/22 98 lb (44.5 kg)  03/17/22 102 lb 3.2 oz (46.4 kg)  12/03/21 103 lb (46.7 kg)    GEN: Well nourished, well  developed, in no acute distress. HEENT: normal. Neck: Supple, no JVD, carotid bruits, or masses. Cardiac: RRR, no murmurs, rubs, or gallops. No clubbing, cyanosis, edema.  Radials/PT 2+ and equal bilaterally.  Respiratory:  Respirations regular and unlabored, clear to auscultation bilaterally. GI: Soft, nontender, nondistended. MS: No deformity or atrophy. Skin: Warm and dry, no rash. Neuro:  Strength and sensation are intact. Psych: Normal affect.  Assessment & Plan    CAD -DES x2 to LAD 11/2021.  Recommended for DAPT aspirin/Plavix for at least 12 months.  Additional GDMT includes rosuvastatin, Plavix, aspirin, as needed nitroglycerin.  Did not tolerate metoprolol due to increased fatigue (this is likely multifactorial, poor p.o. intake, anxiety, stressors).  Discontinue metoprolol and check in via MyChart in 2 weeks.  HLD, LDL goal less than 70 -started rosuvastatin 5 mg 11/2021 which caused itching. She was transitioned to Pravastatin. Due for fasting lipid panel which she will return for prior to her next office visit.   Tobacco use - Smoking cessation encouraged. Recommend utilization of [id number].   Hypothyroidism - Continue to follow with PCP.   Anxiety / Chronic pain - Continue to follow with PCP.  We discussed that her disability would likely related to anxiety or chronic pain will need to be addressed by her primary care provider.     Disposition: Follow up in 3 month(s) with Skeet Latch, MD or APP.  Signed, Loel Dubonnet, NP 05/13/2022, 3:24 PM [facility]

## 2022-05-13 NOTE — Patient Instructions (Addendum)
Medication Instructions:  Your physician has recommended you make the following change in your medication:   STOP Metoprolol  We will send a MyChart message in 2 weeks to check in on how you feel after stopping the Metoprolol _______________  Faythe Ghee to take the Pravastatin, Aspirin, Clopidogrel (Plavix) together in the evening.   *If you need a refill on your cardiac medications before your next appointment, please call your pharmacy*   Lab Work/Testing/Procedures: Your physician recommends that you return for lab work at your convenience prior to your next office visit: lipid panel, CMP Please be fasting for your labs.  Please return for Lab work. You may come to the...   [facility] (3rd floor) [address], [place], [place] [address]  Open: 8am-Noon and 1pm-4:30pm  Please ring the doorbell on the small table when you exit the elevator and the Lab Tech will come get you  [place] [facility] [address] [place], [place], [place] [address] Open: 8am-1pm, then 2pm-4:30pm   [place]- Please see attached locations sheet stapled to your lab work with address and hours.    Follow-Up: At [facility], you and your health needs are our priority.  As part of our continuing mission to provide you with exceptional heart care, we have created designated Provider Care Teams.  These Care Teams include your primary Cardiologist (physician) and Advanced Practice Providers (APPs -  Physician Assistants and Nurse Practitioners) who all work together to provide you with the care you need, when you need it.  We recommend signing up for the patient portal called "MyChart".  Sign up information is provided on this After Visit Summary.  MyChart is used to connect with patients for Virtual Visits (Telemedicine).  Patients are able to view lab/test results, encounter notes, upcoming appointments, etc.  Non-urgent messages can be sent to your provider  as well.   To learn more about what you can do with MyChart, go to [email or web address].    Your next appointment:   3-4 month(s)  The format for your next appointment:   In Person  Provider:   Skeet Latch, MD    Other Instructions  For coronary artery disease often called "heart disease" we aim for optimal guideline directed medical therapy. We use the "A, B, C"s to help keep [place] on track!  A = Aspirin '81mg'$  daily B = Blood pressure control. If we can, like to use beta blocker like Metoprolol but as you did not tolerate well we have stopped it.  C = Cholesterol control. You take Pravastatin  to help control your cholesterol.  D = Don't forget nitroglycerin! This is an emergency tablet to be used if you have chest pain. E = Extras. In your case, this is Clopidogrel (Plavix) ot help protect your stent.

## 2022-05-27 ENCOUNTER — Encounter (HOSPITAL_BASED_OUTPATIENT_CLINIC_OR_DEPARTMENT_OTHER): Payer: Self-pay

## 2022-05-27 MED ORDER — METOPROLOL TARTRATE 25 MG PO TABS: 25.0000 mg | ORAL_TABLET | Freq: Two times a day (BID) | ORAL | 3 refills | Status: DC

## 2022-05-27 NOTE — Telephone Encounter (Signed)
Update as requested

## 2022-05-27 NOTE — Addendum Note (Signed)
Addended by: Loel Dubonnet on: 05/27/2022 04:35 PM   Modules accepted: Orders

## 2022-07-10 DIAGNOSIS — Z6820 Body mass index (BMI) 20.0-20.9, adult: Secondary | ICD-10-CM | POA: Diagnosis not present

## 2022-07-10 DIAGNOSIS — R69 Illness, unspecified: Secondary | ICD-10-CM | POA: Diagnosis not present

## 2022-07-10 DIAGNOSIS — J449 Chronic obstructive pulmonary disease, unspecified: Secondary | ICD-10-CM | POA: Diagnosis not present

## 2022-07-10 DIAGNOSIS — F4010 Social phobia, unspecified: Secondary | ICD-10-CM | POA: Diagnosis not present

## 2022-07-17 DIAGNOSIS — D485 Neoplasm of uncertain behavior of skin: Secondary | ICD-10-CM | POA: Diagnosis not present

## 2022-07-17 DIAGNOSIS — R69 Illness, unspecified: Secondary | ICD-10-CM | POA: Diagnosis not present

## 2022-07-17 DIAGNOSIS — L570 Actinic keratosis: Secondary | ICD-10-CM | POA: Diagnosis not present

## 2022-07-17 DIAGNOSIS — R634 Abnormal weight loss: Secondary | ICD-10-CM | POA: Diagnosis not present

## 2022-07-17 DIAGNOSIS — C44729 Squamous cell carcinoma of skin of left lower limb, including hip: Secondary | ICD-10-CM | POA: Diagnosis not present

## 2022-08-19 ENCOUNTER — Telehealth (HOSPITAL_BASED_OUTPATIENT_CLINIC_OR_DEPARTMENT_OTHER): Payer: Self-pay | Admitting: Cardiovascular Disease

## 2022-08-19 ENCOUNTER — Telehealth: Payer: Self-pay | Admitting: *Deleted

## 2022-08-19 NOTE — Telephone Encounter (Signed)
error 

## 2022-08-19 NOTE — Telephone Encounter (Signed)
   Pre-operative Risk Assessment    Patient Name: Wendy Bryan  DOB: September 05, 1960 MRN: [id number]      Request for Surgical Clearance    Procedure:   Left Distal Calf, Squamous Cell Carcinoma  Date of Surgery:  Clearance 08/27/22                                 Surgeon:  Dr. Winifred Olive Surgeon's Group or Practice Name:  [facility] Phone number:  [phone] Fax number:  [phone]   Type of Clearance Requested:   - Pharmacy:  Hold Aspirin and Clopidogrel (Plavix)     Type of Anesthesia:  None    Additional requests/questions:   Patient stated she is having skin surgery on 4/3 and wants to go off her clopidogrel (PLAVIX) 75 MG tablet and aspirin EC 81 MG tablet for a few days prior to the procedure.  Mateo Flow at [facility] stated they are not requiring the patient to go off her blood thinner but patient states she bleeds badly and want go stop this medication for a few days.  Signed, Heloise Beecham   08/19/2022, 2:26 PM

## 2022-08-19 NOTE — Telephone Encounter (Signed)
Dr. Oval Linsey Patient is requesting to hold Plavix and aspirin for upcoming skin cancer surgery although requesting provider is willing to complete the procedure with patient on DAPT. Our CMA called to advise patient that it is not recommended that she hold these medications until after one year post PCI/DES placement which was 11/25/21, however she requests to proceed. We have scheduled a telehealth appointment to ensure no concerning cardiac symptoms but need you to advise regarding DAPT.  Please send your response to p cv div preop  Thank you, Sharyn Lull

## 2022-08-19 NOTE — Telephone Encounter (Signed)
Patient had PCI with multiple stents placed 11/25/21, therefore recommendation is to continue aspirin and Plavix for one year without interruption. If she chooses to proceed with procedure at this time, we will need to do a virtual visit to ensure no concerning cardiac symptoms and get agreement from Dr Oval Linsey for her to come off the antiplatelet medications.  Please update preop APP once you have spoken with the patient.  Thank you, Sharyn Lull

## 2022-08-19 NOTE — Telephone Encounter (Signed)
I s/w the pt and she said she would like to do a tele pre op appt 08/21/22 @ 3 pm. I did go over the recommendations in regard to not holding DAPT until 11/26/22 or after. Pt is aware we will need to get permission from Dr. Oval Linsey if she will be ok to let pt hold DAPT, Plavix and ASA for skin cancer surgery.    I did call the surgeon's office as well and reviewed the recommendations, though we will d/w pt and see what direction she wants to go. Pt has appt with Dr. Oval Linsey May 05/2022 and wants to keep that appt as well. Med rec and consent are done.

## 2022-08-19 NOTE — Telephone Encounter (Signed)
I s/w the pt and she said she would like to do a tele pre op appt 08/21/22 @ 3 pm. I did go over the recommendations in regard to not holding DAPT until 11/26/22 or after. Pt is aware we will need to get permission from Dr. Oval Linsey if she will be ok to let pt hold DAPT, Plavix and ASA for skin cancer surgery.   I did call the surgeon's office as well and reviewed the recommendations, though we will d/w pt and see what direction she wants to go. Pt has appt with Dr. Oval Linsey May 05/2022 and wants to keep that appt as well. Med rec and consent are done.      Patient Consent for Virtual Visit        Wendy Bryan has provided verbal consent on 08/19/2022 for a virtual visit (video or telephone).   CONSENT FOR VIRTUAL VISIT FOR:  Wendy Bryan  By participating in this virtual visit I agree to the following:  I hereby voluntarily request, consent and authorize [facility] and its employed or contracted physicians, physician assistants, nurse practitioners or other licensed health care professionals (the Practitioner), to provide me with telemedicine health care services (the "Services") as deemed necessary by the treating Practitioner. I acknowledge and consent to receive the Services by the Practitioner via telemedicine. I understand that the telemedicine visit will involve communicating with the Practitioner through live audiovisual communication technology and the disclosure of certain medical information by electronic transmission. I acknowledge that I have been given the opportunity to request an in-person assessment or other available alternative prior to the telemedicine visit and am voluntarily participating in the telemedicine visit.  I understand that I have the right to withhold or withdraw my consent to the use of telemedicine in the course of my care at any time, without affecting my right to future care or treatment, and that the Practitioner or I may terminate the  telemedicine visit at any time. I understand that I have the right to inspect all information obtained and/or recorded in the course of the telemedicine visit and may receive copies of available information for a reasonable fee.  I understand that some of the potential risks of receiving the Services via telemedicine include:  Delay or interruption in medical evaluation due to technological equipment failure or disruption; Information transmitted may not be sufficient (e.g. poor resolution of images) to allow for appropriate medical decision making by the Practitioner; and/or  In rare instances, security protocols could fail, causing a breach of personal health information.  Furthermore, I acknowledge that it is my responsibility to provide information about my medical history, conditions and care that is complete and accurate to the best of my ability. I acknowledge that Practitioner's advice, recommendations, and/or decision may be based on factors not within their control, such as incomplete or inaccurate data provided by me or distortions of diagnostic images or specimens that may result from electronic transmissions. I understand that the practice of medicine is not an exact science and that Practitioner makes no warranties or guarantees regarding treatment outcomes. I acknowledge that a copy of this consent can be made available to me via my patient portal ([facility]), or I can request a printed copy by calling the office of [facility].    I understand that my insurance will be billed for this visit.   I have read or had this consent read to me. I understand the contents of this consent,  which adequately explains the benefits and risks of the Services being provided via telemedicine.  I have been provided ample opportunity to ask questions regarding this consent and the Services and have had my questions answered to my satisfaction. I give my informed consent for the services  to be provided through the use of telemedicine in my medical care   3

## 2022-08-21 ENCOUNTER — Ambulatory Visit: Payer: PRIVATE HEALTH INSURANCE | Attending: Cardiology | Admitting: Nurse Practitioner

## 2022-08-21 DIAGNOSIS — Z01810 Encounter for preprocedural cardiovascular examination: Secondary | ICD-10-CM | POA: Diagnosis not present

## 2022-08-21 NOTE — Progress Notes (Signed)
Virtual Visit via Telephone Note   Because of Wendy Bryan's co-morbid illnesses, she is at least at moderate risk for complications without adequate follow up.  This format is felt to be most appropriate for this patient at this time.  The patient did not have access to video technology/had technical difficulties with video requiring transitioning to audio format only (telephone).  All issues noted in this document were discussed and addressed.  No physical exam could be performed with this format.  Please refer to the patient's chart for her consent to telehealth for [facility].  Evaluation Performed:  Preoperative cardiovascular risk assessment _____________   Date:  08/21/2022   Patient ID:  Wendy Bryan, DOB 17-Aug-1960, MRN [id number] Patient Location:  Home Provider location:   Office  Primary Care Provider:  Redmond School, MD Primary Cardiologist:  Skeet Latch, MD  Chief Complaint / Patient Profile   62 y.o. y/o female with a h/o CAD s/p orbital atherectomy and stenting of severe calcific tandem stenoses in proximal mid LAD, DES x 2 in 11/2021, hypertension, hyperlipidemia, and tobacco use who is pending left distal calf, squamous cell carcinoma removal on 08/27/2022 with Dr[facility] and presents today for telephonic preoperative cardiovascular risk assessment.  History of Present Illness    Wendy Bryan is a 63 y.o. female who presents via audio/video conferencing for a telehealth visit today.  Pt was last seen in cardiology clinic on 05/13/2022 by Dr. Oval Linsey. At that time [name] was doing well. The patient is now pending procedure as outlined above. Since her last visit, she notes increased dyspnea on exertion, generalized fatigue. She thinks her COPD and anxiety could be contributing to her symptoms, though she does not DOE prior to her stents.  She denies chest pain, palpitations, pnd, orthopnea, n, v,  dizziness, syncope, edema, weight gain, or early satiety. All other systems reviewed and are otherwise negative except as noted above.    Past Medical History    Past Medical History:  Diagnosis Date   Anxiety 03/17/2022   Chest pain of uncertain etiology 0000000   Essential hypertension 03/17/2022   No pertinent past medical history    Pure hypercholesterolemia 10/15/2021   Past Surgical History:  Procedure Laterality Date   ABDOMINAL HYSTERECTOMY     APPENDECTOMY     CHOLECYSTECTOMY     COLONOSCOPY  02/19/2012   Procedure: COLONOSCOPY;  Surgeon: Daneil Dolin, MD;  [facility];  Service: Endoscopy;  Laterality: N/A;  9:45 AM   CORONARY ATHERECTOMY N/A 11/25/2021   Procedure: CORONARY ATHERECTOMY;  Surgeon: Sherren Mocha, MD;  Location: [facility];  Service: Cardiovascular;  Laterality: N/A;   CORONARY STENT INTERVENTION N/A 11/25/2021   Procedure: CORONARY STENT INTERVENTION;  Surgeon: Sherren Mocha, MD;  Location: [facility];  Service: Cardiovascular;  Laterality: N/A;   INTRAVASCULAR PRESSURE WIRE/FFR STUDY N/A 11/13/2021   Procedure: INTRAVASCULAR PRESSURE WIRE/FFR STUDY;  Surgeon: Sherren Mocha, MD;  Location: [facility];  Service: Cardiovascular;  Laterality: N/A;   LEFT HEART CATH AND CORONARY ANGIOGRAPHY N/A 11/13/2021   Procedure: LEFT HEART CATH AND CORONARY ANGIOGRAPHY;  Surgeon: Sherren Mocha, MD;  Location: [facility];  Service: Cardiovascular;  Laterality: N/A;    Allergies  Allergies  Allergen Reactions   Demerol [Meperidine] Nausea Only   Other Other (See Comments)    Anesthesia-nausea/vomiting   Rosuvastatin Itching    On 5 mg  dose. Overall mild.    Home Medications    Prior to Admission medications   Medication Sig Start Date End Date Taking? Authorizing Provider  albuterol (VENTOLIN HFA) 108 (90 Base) MCG/ACT inhaler Inhale 1-2 puffs into the lungs every 6 (six) hours as needed for shortness of breath or  wheezing. 09/04/21   [provider]  ALPRAZolam Duanne Moron) 1 MG tablet Take 1 mg by mouth 3 (three) times daily as needed for anxiety. 03/31/15   [provider]  aspirin EC 81 MG tablet Take 81 mg by mouth daily. Swallow whole.    [provider]  clopidogrel (PLAVIX) 75 MG tablet Take 1 tablet (75 mg total) by mouth daily. 05/13/22 05/13/23  Loel Dubonnet, NP  clotrimazole-betamethasone (LOTRISONE) cream Apply 1 Application topically 2 (two) times daily as needed (skin irritation (underarms)).    [provider]  cyanocobalamin (,VITAMIN B-12,) 1000 MCG/ML injection Inject into the muscle every 30 (thirty) days. 07/09/21   [provider]  diphenhydrAMINE (BENADRYL) 25 mg capsule Take 25 mg by mouth at bedtime as needed for itching.    [provider]  escitalopram (LEXAPRO) 20 MG tablet Take 20 mg by mouth daily. 02/20/22   [provider]  hydrOXYzine (ATARAX) 25 MG tablet Take 25 mg by mouth as needed for anxiety or itching. 11/29/21   [provider]  levothyroxine (SYNTHROID) 50 MCG tablet Take 50 mcg by mouth daily before breakfast. 10/02/21   [provider]  metoprolol tartrate (LOPRESSOR) 25 MG tablet Take 1 tablet (25 mg total) by mouth 2 (two) times daily. 05/27/22 05/22/23  Loel Dubonnet, NP  naphazoline-pheniramine (ALLERGY EYE) 0.025-0.3 % ophthalmic solution Place 1 drop into both eyes 4 (four) times daily as needed for allergies.    [provider]  nitroGLYCERIN (NITROSTAT) 0.4 MG SL tablet Place 1 tablet (0.4 mg total) under the tongue every 5 (five) minutes as needed for chest pain (up to 3 doses. If taking 3rd dose, call 911). 11/25/21   Sherren Mocha, MD  nystatin cream (MYCOSTATIN) Apply 1 Application topically 2 (two) times daily. 11/29/21   [provider]  pravastatin (PRAVACHOL) 20 MG tablet Take 1 tablet (20 mg total) by mouth every evening. 05/13/22 05/08/23  Loel Dubonnet, NP     Physical Exam    Vital Signs:  [name] does not have vital signs available for review today.  Given telephonic nature of communication, physical exam is limited. AAOx3. NAD. Normal affect.  Speech and respirations are unlabored.  Accessory Clinical Findings    None  Assessment & Plan    1.  Preoperative Cardiovascular Risk Assessment:  According to the Revised Cardiac Risk Index (RCRI), her Perioperative Risk of Major Cardiac Event is (%): 0.9. Her Functional Capacity in METs is: 5.04 according to the [employer] Activity Status Index (DASI). Pt does note increased dyspnea on exertion, which she states may be related to her COPD and anxiety. She also had DOE prior to her cardiac stents. Pt declines any additional cardiac testing prior to surgery and states" I think it is my COPD and anxiety. I have to get this cancer out of my body."  The patient was advised that if she develops new symptoms prior to surgery to contact our office to arrange for a follow-up visit, and she verbalized understanding. She was also advised to keep her follow-up as scheduled with Dr. Oval Linsey.   Patient had PCI with multiple stents placed 11/25/21, therefore recommendation is  to continue aspirin and Plavix for one year without interruption. If she chooses to proceed with procedure at this time, she should continue DAPT without interruption.    A copy of this note will be routed to requesting surgeon.  Time:   Today, I have spent 17 minutes with the patient with telehealth technology discussing medical history, symptoms, and management plan.     Lenna Sciara, NP  08/21/2022, 3:21 PM

## 2022-08-27 DIAGNOSIS — I872 Venous insufficiency (chronic) (peripheral): Secondary | ICD-10-CM | POA: Diagnosis not present

## 2022-08-27 DIAGNOSIS — C44729 Squamous cell carcinoma of skin of left lower limb, including hip: Secondary | ICD-10-CM | POA: Diagnosis not present

## 2022-09-24 ENCOUNTER — Ambulatory Visit (HOSPITAL_BASED_OUTPATIENT_CLINIC_OR_DEPARTMENT_OTHER): Payer: 59 | Admitting: Cardiovascular Disease

## 2022-09-24 ENCOUNTER — Encounter (HOSPITAL_BASED_OUTPATIENT_CLINIC_OR_DEPARTMENT_OTHER): Payer: Self-pay | Admitting: Cardiovascular Disease

## 2022-09-24 VITALS — BP 112/80 | HR 77 | Ht 60.0 in | Wt 95.2 lb

## 2022-09-24 DIAGNOSIS — E7800 Pure hypercholesterolemia, unspecified: Secondary | ICD-10-CM | POA: Diagnosis not present

## 2022-09-24 DIAGNOSIS — F17200 Nicotine dependence, unspecified, uncomplicated: Secondary | ICD-10-CM

## 2022-09-24 DIAGNOSIS — I10 Essential (primary) hypertension: Secondary | ICD-10-CM | POA: Diagnosis not present

## 2022-09-24 DIAGNOSIS — I25119 Atherosclerotic heart disease of native coronary artery with unspecified angina pectoris: Secondary | ICD-10-CM | POA: Diagnosis not present

## 2022-09-24 MED ORDER — METOPROLOL SUCCINATE ER 25 MG PO TB24: 25.0000 mg | ORAL_TABLET | Freq: Every evening | ORAL | 3 refills | Status: DC

## 2022-09-24 NOTE — Assessment & Plan Note (Signed)
Blood pressure is well-controlled.  She complains of having very low energy levels.  I think this is because her oral intake is quite low.  However we will try reducing her metoprolol to see if that helps.  Will change to metoprolol succinate 25 mg daily.

## 2022-09-24 NOTE — Progress Notes (Signed)
Cardiology Office Note:    Date:  09/24/2022   ID:  Wendy Bryan, DOB [birth year], MRN [id number]  PCP:  Elfredia Nevins, MD   [facility] Providers Cardiologist:  Chilton Si, MD     Referring MD: Elfredia Nevins, MD   No chief complaint on file.   History of Present Illness:    Wendy Bryan is a 62 y.o. female with a hx of CAD s/p LAD PCI, smoking, hypothyroidism, hyperlipidemia and COPD presenting today for follow up. She was seen 03/23 by her PCP for shortness of breath and was treated for COPD.  She was first seen 09/2021 for chest pain. She was referred for a coronary CTA which was concerning for LAD disease. FFR was positive and she was referred for cardiac catheterization where she was found to have severe stenosis in proximal LAD and moderate stenosis in mid LAD. She underwent staged PCI with orbital atherectomy. She followed up with Gillian Shields, NP 11/2021 and still reported feeling tired but her shortness of breath was better and she had no more chest pain. She was encouraged to participate in cardiac rehab and to quit smoking.  At her last visit 02/2022 she was struggling with depression.  She was stable from a cardiac standpoint.  Blood pressures were mildly elevated and metoprolol was added.  This was also help with her anxiety.  She had a virtual visit with Bernadene Person, NP on 07/2022 for preoperative risk assessment and was felt to be low risk for surgery.  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 She was to keep her feet elevated for 14 days from 4/3 to 4/17. She wore a cast from her knee to her toes, which limited her from exercising. She endorses fatigue with minimal exertion, which she believes may be attributed to her medication. She also has a poor appetite since her surgery in 11/2022 and her weight has gradually decreased. She occasionally drinks drinks like boost or ensure about 3-4 times a  week, but states the sweetness makes her nauseous. She drinks water constantly throughout the day. She also sometimes drinks diet Dr. Reino Kent or Sprite. She continues to try to limit how much she smokes each day. She has been eating peppermints to help her cravings. She denies any palpitations, chest pain, shortness of breath, or peripheral edema. No lightheadedness, headaches, syncope, orthopnea, or PND.  Past Medical History:  Diagnosis Date   Anxiety 03/17/2022   Chest pain of uncertain etiology 10/15/2021   Essential hypertension 03/17/2022   No pertinent past medical history    Pure hypercholesterolemia 10/15/2021    Past Surgical History:  Procedure Laterality Date   ABDOMINAL HYSTERECTOMY     APPENDECTOMY     CHOLECYSTECTOMY     COLONOSCOPY  02/19/2012   Procedure: COLONOSCOPY;  Surgeon: Corbin Ade, MD;  [facility];  Service: Endoscopy;  Laterality: N/A;  9:45 AM   CORONARY ATHERECTOMY N/A 11/25/2021   Procedure: CORONARY ATHERECTOMY;  Surgeon: Tonny Bollman, MD;  Location: [facility] INVASIVE CV LAB;  Service: Cardiovascular;  Laterality: N/A;   CORONARY PRESSURE/FFR STUDY N/A 11/13/2021   Procedure: INTRAVASCULAR PRESSURE WIRE/FFR STUDY;  Surgeon: Tonny Bollman, MD;  Location: [facility] INVASIVE CV LAB;  Service: Cardiovascular;  Laterality: N/A;   CORONARY STENT INTERVENTION N/A 11/25/2021   Procedure: CORONARY STENT INTERVENTION;  Surgeon: Tonny Bollman, MD;  Location: [facility] INVASIVE CV LAB;  Service: Cardiovascular;  Laterality: N/A;  LEFT HEART CATH AND CORONARY ANGIOGRAPHY N/A 11/13/2021   Procedure: LEFT HEART CATH AND CORONARY ANGIOGRAPHY;  Surgeon: Tonny Bollman, MD;  Location: [facility] INVASIVE CV LAB;  Service: Cardiovascular;  Laterality: N/A;    Current Medications: Current Meds  Medication Sig   albuterol (PROVENTIL) 2 MG tablet Take 2 mg by mouth 3 (three) times daily.   albuterol (VENTOLIN HFA) 108 (90 Base) MCG/ACT inhaler Inhale 1-2 puffs into the lungs every 6 (six)  hours as needed for shortness of breath or wheezing.   ALPRAZolam (XANAX) 1 MG tablet Take 1 mg by mouth 3 (three) times daily as needed for anxiety.   aspirin EC 81 MG tablet Take 81 mg by mouth daily. Swallow whole.   clopidogrel (PLAVIX) 75 MG tablet Take 1 tablet (75 mg total) by mouth daily.   clotrimazole-betamethasone (LOTRISONE) cream Apply 1 Application topically 2 (two) times daily as needed (skin irritation (underarms)).   cyanocobalamin (,VITAMIN B-12,) 1000 MCG/ML injection Inject into the muscle every 30 (thirty) days.   diphenhydrAMINE (BENADRYL) 25 mg capsule Take 25 mg by mouth at bedtime as needed for itching.   escitalopram (LEXAPRO) 20 MG tablet Take 20 mg by mouth daily.   hydrOXYzine (ATARAX) 25 MG tablet Take 25 mg by mouth as needed for anxiety or itching.   levothyroxine (SYNTHROID) 50 MCG tablet Take 50 mcg by mouth daily before breakfast.   metoprolol succinate (TOPROL XL) 25 MG 24 hr tablet Take 1 tablet (25 mg total) by mouth every evening.   naphazoline-pheniramine (ALLERGY EYE) 0.025-0.3 % ophthalmic solution Place 1 drop into both eyes 4 (four) times daily as needed for allergies.   nitroGLYCERIN (NITROSTAT) 0.4 MG SL tablet Place 1 tablet (0.4 mg total) under the tongue every 5 (five) minutes as needed for chest pain (up to 3 doses. If taking 3rd dose, call 911).   nystatin cream (MYCOSTATIN) Apply 1 Application topically 2 (two) times daily.   pravastatin (PRAVACHOL) 20 MG tablet Take 1 tablet (20 mg total) by mouth every evening.   [DISCONTINUED] metoprolol tartrate (LOPRESSOR) 25 MG tablet Take 1 tablet (25 mg total) by mouth 2 (two) times daily.     Allergies:   Demerol [meperidine], Other, and Rosuvastatin   Social History   Socioeconomic History   Marital status: Married    Spouse name: Not on file   Number of children: Not on file   Years of education: Not on file   Highest education level: Not on file  Occupational History   Not on file  Tobacco  Use   Smoking status: Every Day    Packs/day: 1.00    Years: 30.00    Additional pack years: 0.00    Total pack years: 30.00    Types: Cigarettes   Smokeless tobacco: Never  Substance and Sexual Activity   Alcohol use: Yes    Alcohol/week: 0.0 standard drinks of alcohol    Comment: occsional beer and wine   Drug use: No   Sexual activity: Not on file  Other Topics Concern   Not on file  Social History Narrative   Lives with husband and son in a one story home.  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  Education: high school, some college.   Social Determinants of Health   Financial Resource Strain: Not on file  Food Insecurity: Not on file  Transportation Needs: Not on file  Physical Activity: Not on file  Stress: Not on file  Social Connections: Not on file  Family History: The patient's family history includes Heart disease in her mother; Heart failure in her mother; Hyperlipidemia in her father; Hypertension in her father; Thyroid disease in her father. There is no history of Colon cancer.  ROS:   Please see the history of present illness.    (+) fatigue  (+) weight loss/low appetite  All other systems reviewed and are negative.  EKGs/Labs/Other Studies Reviewed:    The following studies were reviewed today: none  EKG:  09/24/2022: EKG was not ordered. 12/03/2021: Sinus rhythm. Rate 73 bpm.  Recent Labs: 01/09/2022: ALT 13; BUN 10; Creatinine, Ser 0.71; Hemoglobin 14.5; Platelets 144; Potassium 4.2; Sodium 143; TSH 3.260    02/2021: Total Cholesterol - 235 Triglycerides - 91 HDL -69 LDL -152 TSH - 1.3   Recent Lipid Panel    Component Value Date/Time   CHOL 180 01/09/2022 1111   TRIG 121 01/09/2022 1111   HDL 62 01/09/2022 1111   CHOLHDL 2.9 01/09/2022 1111   LDLCALC 97 01/09/2022 1111    Physical Exam:    VS:  BP 112/80 (BP Location: Left Arm, Patient Position: Sitting, Cuff Size: Normal)   Pulse 77   Ht 5' (1.524 m)   Wt 95 lb 3.2 oz (43.2 kg)    SpO2 98%   BMI 18.59 kg/m  , BMI Body mass index is 18.59 kg/m. GENERAL:  Well appearing HEENT: Pupils equal round and reactive, fundi not visualized, oral mucosa unremarkable NECK:  No jugular venous distention, waveform within normal limits, carotid upstroke brisk and symmetric, no bruits, no thyromegaly LUNGS:  Clear to auscultation bilaterally HEART:  RRR.  PMI not displaced or sustained,S1 and S2 within normal limits, no S3, no S4, no clicks, no rubs, no murmurs ABD:  Flat, positive bowel sounds normal in frequency in pitch, no bruits, no rebound, no guarding, no midline pulsatile mass, no hepatomegaly, no splenomegaly EXT:  2 plus pulses throughout, no edema, no cyanosis no clubbing SKIN:  No rashes no nodules NEURO:  Cranial nerves II through XII grossly intact, motor grossly intact throughout PSYCH:  Cognitively intact, oriented to person place and time   ASSESSMENT:    1. Essential hypertension   2. Coronary artery disease involving native coronary artery of native heart with angina pectoris (HCC)   3. Tobacco dependence   4. Pure hypercholesterolemia      PLAN:    In order of problems listed above:  Essential hypertension Blood pressure is well-controlled.  She complains of having very low energy levels.  I think this is because her oral intake is quite low.  However we will try reducing her metoprolol to see if that helps.  Will change to metoprolol succinate 25 mg daily.  Coronary artery disease involving native coronary artery of native heart with angina pectoris [facility]) She has no angina.  Encouraged her to work on increasing her exercise.  Continue aspirin and clopidogrel.  Continue metoprolol but reduce the dose as above.  Continue pravastatin.  She is due for fasting lipids and a CMP.  Her LDL goal is less than 70.  She has lab work with her PCP next month and she will have this done at that time.  She can stop taking clopidogrel 11/27/2022.  Tobacco dependence She is  trying to wean her to go abuse.  Encouraged her to keep trying.  Pure hypercholesterolemia Lipids are uncontrolled as above.  Check fasting lipids with her lab work next month.  Continue pravastatin.     Disposition:  Follow up with Marcella Charlson C. Duke Salvia, MD, [facility] in 6 months  Medication Adjustments/Labs and Tests Ordered: Current medicines are reviewed at length with the patient today.  Concerns regarding medicines are outlined above.  No orders of the defined types were placed in this encounter.  Meds ordered this encounter  Medications   metoprolol succinate (TOPROL XL) 25 MG 24 hr tablet    Sig: Take 1 tablet (25 mg total) by mouth every evening.    Dispense:  90 tablet    Refill:  3    D/C METOPROLOL TART   Patient Instructions  Medication Instructions:  JULY 4TH YOU CAN STOP YOUR CLOPIDOGREL  CHANGE YOUR METOPROLOL TO SUCC 25 MG ONCE A DAY IN THE EVENING    *If you need a refill on your cardiac medications before your next appointment, please call your pharmacy*  Lab Work: NONE  Testing/Procedures: NONE   Follow-Up: At [facility], you and your health needs are our priority.  As part of our continuing mission to provide you with exceptional heart care, we have created designated Provider Care Teams.  These Care Teams include your primary Cardiologist (physician) and Advanced Practice Providers (APPs -  Physician Assistants and Nurse Practitioners) who all work together to provide you with the care you need, when you need it.  We recommend signing up for the patient portal called "MyChart".  Sign up information is provided on this After Visit Summary.  MyChart is used to connect with patients for Virtual Visits (Telemedicine).  Patients are able to view lab/test results, encounter notes, upcoming appointments, etc.  Non-urgent messages can be sent to your provider as well.   To learn more about what you can do with MyChart, go to [email or web address].    Your  next appointment:   6 month(s)  Provider:   Chilton Si, MD    Other Instructions DECREASE YOUR WATER SO YOU CAN INCREASE YOUR FOOD    I,Rachel Rivera,acting as a scribe for Chilton Si, MD.,have documented all relevant documentation on the behalf of Chilton Si, MD,as directed by  Chilton Si, MD while in the presence of Chilton Si, MD.  I, Celina Shiley C. Duke Salvia, MD have reviewed all documentation for this visit.  The documentation of the exam, diagnosis, procedures, and orders on 09/24/2022 are all accurate and complete.   Signed, Chilton Si, MD  09/24/2022 4:03 PM    [facility]

## 2022-09-24 NOTE — Assessment & Plan Note (Signed)
She is trying to wean her to go abuse.  Encouraged her to keep trying.

## 2022-09-24 NOTE — Patient Instructions (Signed)
Medication Instructions:  JULY 4TH YOU CAN STOP YOUR CLOPIDOGREL  CHANGE YOUR METOPROLOL TO SUCC 25 MG ONCE A DAY IN THE EVENING    *If you need a refill on your cardiac medications before your next appointment, please call your pharmacy*  Lab Work: NONE  Testing/Procedures: NONE   Follow-Up: At [facility], you and your health needs are our priority.  As part of our continuing mission to provide you with exceptional heart care, we have created designated Provider Care Teams.  These Care Teams include your primary Cardiologist (physician) and Advanced Practice Providers (APPs -  Physician Assistants and Nurse Practitioners) who all work together to provide you with the care you need, when you need it.  We recommend signing up for the patient portal called "MyChart".  Sign up information is provided on this After Visit Summary.  MyChart is used to connect with patients for Virtual Visits (Telemedicine).  Patients are able to view lab/test results, encounter notes, upcoming appointments, etc.  Non-urgent messages can be sent to your provider as well.   To learn more about what you can do with MyChart, go to [email or web address].    Your next appointment:   6 month(s)  Provider:   Chilton Si, MD    Other Instructions DECREASE YOUR WATER SO YOU CAN INCREASE YOUR FOOD

## 2022-09-24 NOTE — Assessment & Plan Note (Signed)
She has no angina.  Encouraged her to work on increasing her exercise.  Continue aspirin and clopidogrel.  Continue metoprolol but reduce the dose as above.  Continue pravastatin.  She is due for fasting lipids and a CMP.  Her LDL goal is less than 70.  She has lab work with her PCP next month and she will have this done at that time.  She can stop taking clopidogrel 11/27/2022.

## 2022-09-24 NOTE — Assessment & Plan Note (Signed)
Lipids are uncontrolled as above.  Check fasting lipids with her lab work next month.  Continue pravastatin.

## 2022-09-25 NOTE — Telephone Encounter (Signed)
Dr Duke Salvia saw patient 5/1

## 2022-09-25 NOTE — Telephone Encounter (Signed)
Dr [place] saw patient 5/1 

## 2022-10-24 DIAGNOSIS — Z0001 Encounter for general adult medical examination with abnormal findings: Secondary | ICD-10-CM | POA: Diagnosis not present

## 2022-10-24 DIAGNOSIS — D696 Thrombocytopenia, unspecified: Secondary | ICD-10-CM | POA: Diagnosis not present

## 2022-10-29 DIAGNOSIS — Z681 Body mass index (BMI) 19 or less, adult: Secondary | ICD-10-CM | POA: Diagnosis not present

## 2022-10-29 DIAGNOSIS — J441 Chronic obstructive pulmonary disease with (acute) exacerbation: Secondary | ICD-10-CM | POA: Diagnosis not present

## 2022-10-29 DIAGNOSIS — F4010 Social phobia, unspecified: Secondary | ICD-10-CM | POA: Diagnosis not present

## 2022-10-29 DIAGNOSIS — J449 Chronic obstructive pulmonary disease, unspecified: Secondary | ICD-10-CM | POA: Diagnosis not present

## 2022-10-29 DIAGNOSIS — D696 Thrombocytopenia, unspecified: Secondary | ICD-10-CM | POA: Diagnosis not present

## 2022-10-29 DIAGNOSIS — G9332 Myalgic encephalomyelitis/chronic fatigue syndrome: Secondary | ICD-10-CM | POA: Diagnosis not present

## 2022-10-29 DIAGNOSIS — R634 Abnormal weight loss: Secondary | ICD-10-CM | POA: Diagnosis not present

## 2022-10-29 DIAGNOSIS — E063 Autoimmune thyroiditis: Secondary | ICD-10-CM | POA: Diagnosis not present

## 2022-10-29 DIAGNOSIS — R310 Gross hematuria: Secondary | ICD-10-CM | POA: Diagnosis not present

## 2022-10-29 DIAGNOSIS — F329 Major depressive disorder, single episode, unspecified: Secondary | ICD-10-CM | POA: Diagnosis not present

## 2022-10-29 DIAGNOSIS — F419 Anxiety disorder, unspecified: Secondary | ICD-10-CM | POA: Diagnosis not present

## 2022-10-30 ENCOUNTER — Telehealth: Payer: Self-pay | Admitting: Cardiovascular Disease

## 2022-10-30 DIAGNOSIS — G9332 Myalgic encephalomyelitis/chronic fatigue syndrome: Secondary | ICD-10-CM | POA: Diagnosis not present

## 2022-10-30 DIAGNOSIS — F4010 Social phobia, unspecified: Secondary | ICD-10-CM | POA: Diagnosis not present

## 2022-10-30 DIAGNOSIS — R634 Abnormal weight loss: Secondary | ICD-10-CM | POA: Diagnosis not present

## 2022-10-30 NOTE — Telephone Encounter (Signed)
  Pt is requesting to send a copy of her CT morphe to Dr. Sharyon Medicus office . She gave fax# [phone]

## 2022-10-30 NOTE — Telephone Encounter (Signed)
Records printed and to be faxed by end of day

## 2022-11-06 ENCOUNTER — Telehealth (HOSPITAL_BASED_OUTPATIENT_CLINIC_OR_DEPARTMENT_OTHER): Payer: Self-pay | Admitting: Cardiovascular Disease

## 2022-11-06 NOTE — Telephone Encounter (Signed)
Patient states she had a lipid [facility] [place]. She states her total cholesterol came back at 168.  She also said her thyroid came back at 7% so the increased her medication to 75mg .

## 2022-11-17 NOTE — Telephone Encounter (Signed)
Left message for patient to call back     Per Dr. Duke Salvia,   "Recommend that she start Repatha.  If she needs a PharmD visit for teaching and to discuss further that is OK.  If she is comfortable with injections every two weeks then she can just get the prescription and repeat lipids/CMP in 2-3 months.  TCR"

## 2022-11-29 ENCOUNTER — Other Ambulatory Visit (HOSPITAL_BASED_OUTPATIENT_CLINIC_OR_DEPARTMENT_OTHER): Payer: Self-pay | Admitting: Family

## 2022-11-29 DIAGNOSIS — I25118 Atherosclerotic heart disease of native coronary artery with other forms of angina pectoris: Secondary | ICD-10-CM

## 2022-11-29 DIAGNOSIS — E785 Hyperlipidemia, unspecified: Secondary | ICD-10-CM

## 2022-12-01 NOTE — Telephone Encounter (Signed)
Rx request sent to pharmacy.  

## 2022-12-16 DIAGNOSIS — G9332 Myalgic encephalomyelitis/chronic fatigue syndrome: Secondary | ICD-10-CM | POA: Diagnosis not present

## 2022-12-16 DIAGNOSIS — J449 Chronic obstructive pulmonary disease, unspecified: Secondary | ICD-10-CM | POA: Diagnosis not present

## 2022-12-16 DIAGNOSIS — E063 Autoimmune thyroiditis: Secondary | ICD-10-CM | POA: Diagnosis not present

## 2022-12-16 DIAGNOSIS — F4010 Social phobia, unspecified: Secondary | ICD-10-CM | POA: Diagnosis not present

## 2023-02-10 ENCOUNTER — Other Ambulatory Visit: Payer: Self-pay | Admitting: Internal Medicine

## 2023-02-10 DIAGNOSIS — Z1231 Encounter for screening mammogram for malignant neoplasm of breast: Secondary | ICD-10-CM

## 2023-02-11 DIAGNOSIS — E039 Hypothyroidism, unspecified: Secondary | ICD-10-CM | POA: Diagnosis not present

## 2023-02-26 ENCOUNTER — Ambulatory Visit: Payer: PRIVATE HEALTH INSURANCE

## 2023-03-19 ENCOUNTER — Ambulatory Visit: Payer: PRIVATE HEALTH INSURANCE

## 2023-03-20 ENCOUNTER — Ambulatory Visit: Payer: 59 | Admitting: Orthopedic Surgery

## 2023-03-20 ENCOUNTER — Encounter: Payer: Self-pay | Admitting: Orthopedic Surgery

## 2023-03-20 ENCOUNTER — Other Ambulatory Visit (INDEPENDENT_AMBULATORY_CARE_PROVIDER_SITE_OTHER): Payer: Self-pay

## 2023-03-20 VITALS — BP 145/89 | HR 74 | Ht 60.0 in | Wt 106.0 lb

## 2023-03-20 DIAGNOSIS — M25522 Pain in left elbow: Secondary | ICD-10-CM

## 2023-03-20 DIAGNOSIS — W19XXXA Unspecified fall, initial encounter: Secondary | ICD-10-CM

## 2023-03-20 MED ORDER — TRAMADOL HCL 50 MG PO TABS: 50.0000 mg | ORAL_TABLET | Freq: Two times a day (BID) | ORAL | 0 refills | Status: AC | PRN

## 2023-03-20 NOTE — Progress Notes (Signed)
New Patient Visit  Assessment: Wendy Bryan is a 62 y.o. female with the following: 1. Pain in left elbow  Plan: Veora Fulgham Cabiness fell and injured her left elbow approximately 2 weeks ago.  Radiographs are negative.  She has a lot of bruising and swelling.  Her range of motion is pretty good.  Low concern for instability of the elbow at this time.  She does have some mild tenderness to palpation medially.  Anticipate that this will continue to improve.  Encouraged her to work on her range of motion.  She states her understanding.  Ibuprofen was bothering her stomach, so I provided her with a short course of tramadol.  She will follow-up as needed.  Follow-up: Return if symptoms worsen or fail to improve.  Subjective:  Chief Complaint  Patient presents with   Elbow Pain    L elbow pain after a fall DOI 03/09/23.     History of Present Illness: Wendy Bryan is a 62 y.o. female who presents for evaluation of elbow pain.  She is right-hand dominant.  Approximate 2 weeks ago, she fell, and impacted her left elbow.  She had a lot of bruising and swelling.  Since then, she continues to have pain, although it is improving.  She has been taking some ibuprofen, but this has been bothering her stomach.  She has been icing her left elbow.  No numbness or tingling.  She is currently taking aspirin as she has stents in her heart.   Review of Systems: No fevers or chills No numbness or tingling No chest pain No shortness of breath No bowel or bladder dysfunction No GI distress No headaches   Medical History:  Past Medical History:  Diagnosis Date   Anxiety 03/17/2022   Chest pain of uncertain etiology 10/15/2021   Essential hypertension 03/17/2022   No pertinent past medical history    Pure hypercholesterolemia 10/15/2021    Past Surgical History:  Procedure Laterality Date   ABDOMINAL HYSTERECTOMY     APPENDECTOMY     CHOLECYSTECTOMY     COLONOSCOPY  02/19/2012    Procedure: COLONOSCOPY;  Surgeon: Corbin Ade, MD;  [facility];  Service: Endoscopy;  Laterality: N/A;  9:45 AM   CORONARY ATHERECTOMY N/A 11/25/2021   Procedure: CORONARY ATHERECTOMY;  Surgeon: Tonny Bollman, MD;  Location: [facility] INVASIVE CV LAB;  Service: Cardiovascular;  Laterality: N/A;   CORONARY PRESSURE/FFR STUDY N/A 11/13/2021   Procedure: INTRAVASCULAR PRESSURE WIRE/FFR STUDY;  Surgeon: Tonny Bollman, MD;  Location: [facility] INVASIVE CV LAB;  Service: Cardiovascular;  Laterality: N/A;   CORONARY STENT INTERVENTION N/A 11/25/2021   Procedure: CORONARY STENT INTERVENTION;  Surgeon: Tonny Bollman, MD;  Location: [facility] INVASIVE CV LAB;  Service: Cardiovascular;  Laterality: N/A;   LEFT HEART CATH AND CORONARY ANGIOGRAPHY N/A 11/13/2021   Procedure: LEFT HEART CATH AND CORONARY ANGIOGRAPHY;  Surgeon: Tonny Bollman, MD;  Location: [facility] INVASIVE CV LAB;  Service: Cardiovascular;  Laterality: N/A;    Family History  Problem Relation Age of Onset   Heart disease Mother    Heart failure Mother    Thyroid disease Father    Hypertension Father    Hyperlipidemia Father    Colon cancer Neg Hx    Social History   Tobacco Use   Smoking status: Every Day    Current packs/day: 1.00    Average packs/day: 1 pack/day for 30.0 years (30.0 ttl pk-yrs)    Types: Cigarettes   Smokeless tobacco: Never  Substance Use Topics   Alcohol use: Yes    Alcohol/week: 0.0 standard drinks of alcohol    Comment: occsional beer and wine   Drug use: No    Allergies  Allergen Reactions   Demerol [Meperidine] Nausea Only   Other Other (See Comments)    Anesthesia-nausea/vomiting   Rosuvastatin Itching    On 5 mg dose. Overall mild.    Current Meds  Medication Sig   traMADol (ULTRAM) 50 MG tablet Take 1 tablet (50 mg total) by mouth every 12 (twelve) hours as needed.    Objective: BP (!) 145/89   Pulse 74   Ht 5' (1.524 m)   Wt 106 lb (48.1 kg)   BMI 20.70 kg/m   Physical Exam:  General:  Alert and oriented. and No acute distress. Gait: Normal gait.  Left elbow with swelling and extensive ecchymosis, primarily over the medial elbow, extending distally.  Mild tenderness to palpation over the medial elbow.  No increased laxity to varus or valgus stress.  Slightly restricted elbow range of motion, but she has good flexion, and can achieve full extension.  Fingers warm well-perfused.  Restricted grip strength due to pain in the elbow.  IMAGING: I personally ordered and reviewed the following images  X-rays of the left elbow were obtained in clinic today.  No acute injuries are noted.  No evidence of degenerative changes.  No occult fracture.  No bone spurs.  No bony lesions.  Impression: Negative left elbow x-rays.   New Medications:  Meds ordered this encounter  Medications   traMADol (ULTRAM) 50 MG tablet    Sig: Take 1 tablet (50 mg total) by mouth every 12 (twelve) hours as needed.    Dispense:  20 tablet    Refill:  0      Oliver Barre, MD  03/20/2023 9:47 AM

## 2023-03-27 ENCOUNTER — Ambulatory Visit (HOSPITAL_BASED_OUTPATIENT_CLINIC_OR_DEPARTMENT_OTHER): Payer: 59 | Admitting: Cardiovascular Disease

## 2023-03-27 ENCOUNTER — Encounter (HOSPITAL_BASED_OUTPATIENT_CLINIC_OR_DEPARTMENT_OTHER): Payer: Self-pay | Admitting: Cardiovascular Disease

## 2023-03-27 VITALS — BP 122/76 | HR 69 | Ht 60.0 in | Wt 102.4 lb

## 2023-03-27 DIAGNOSIS — F17200 Nicotine dependence, unspecified, uncomplicated: Secondary | ICD-10-CM

## 2023-03-27 DIAGNOSIS — I25119 Atherosclerotic heart disease of native coronary artery with unspecified angina pectoris: Secondary | ICD-10-CM | POA: Diagnosis not present

## 2023-03-27 DIAGNOSIS — E7800 Pure hypercholesterolemia, unspecified: Secondary | ICD-10-CM

## 2023-03-27 DIAGNOSIS — I10 Essential (primary) hypertension: Secondary | ICD-10-CM

## 2023-03-27 NOTE — Progress Notes (Signed)
Cardiology Office Note:  .   Date:  04/26/2023  ID:  Wendy Bryan, DOB 1960-08-27, MRN [id number] PCP: Elfredia Nevins, MD  [facility] Providers Cardiologist:  Chilton Si, MD    History of Present Illness: .   Wendy Bryan is a 62 y.o. female with a hx of CAD s/p LAD PCI, smoking, hypothyroidism, hyperlipidemia and COPD presenting today for follow up. She was seen 03/23 by her PCP for shortness of breath and was treated for COPD.  She was first seen 09/2021 for chest pain. She was referred for a coronary CTA which was concerning for LAD disease. FFR was positive and she was referred for cardiac catheterization where she was found to have severe stenosis in proximal LAD and moderate stenosis in mid LAD. She underwent staged PCI with orbital atherectomy. She followed up with Wendy Shields, NP 11/2021 and still reported feeling tired but her shortness of breath was better and she had no more chest pain. She was encouraged to participate in cardiac rehab and to quit smoking.   At her visit 02/2022 she was struggling with depression.  She was stable from a cardiac standpoint.  Blood pressures were mildly elevated and metoprolol was added.  This was also help with her anxiety.  She had a virtual visit with Wendy Person, NP on 07/2022 for preoperative risk assessment and was felt to be low risk for surgery.  At her visit 09/2022 she was working to cute back on smoking.  Metoprolol was reduced due to fatigue.  Wendy Bryan presents with increased anxiety and persistent shortness of breath. She reports a sensation of heaviness in the chest, which she is unsure if it is related to her heart condition, COPD, or anxiety. The patient notes that these symptoms often occur when she is worried or overexerts herself, leading to fatigue and breathlessness. She also reports feeling tired frequently, with a desire to sleep often.  Wendy Bryan notes that her shortness of breath is exacerbated by  physical activities such as vacuuming or sweeping. She reports that her breathing has worsened since her stent placement last year, with no significant period of improvement post-procedure. The patient admits to not using her inhaler as frequently as she should due to the side effects.  She also reports nocturnal wheezing and has been on an as-needed albuterol regimen. She has not been on any daily inhaler or maintenance medication for her COPD. The patient also has a history of a skin lesion which required surgical removal, and a recent fall resulting in significant bruising and swelling in the elbow.  Wendy Bryan notes that her stress levels have been high due to caregiving responsibilities for her elderly father, who recently had a fall. This has led to an increase in her smoking habit, which varies depending on the stress level of the day. The patient's mental health has been a significant concern, leading to approval for disability benefits. She has been started on Abilify to help with sleep and anxiety.  She denies LE edema, orthopnea or PND.       ROS:  As per HPI   Studies Reviewed: Marland Kitchen   EKG Interpretation Date/Time:  Friday March 27 2023 14:51:17 EDT Ventricular Rate:  67 PR Interval:  140 QRS Duration:  72 QT Interval:  406 QTC Calculation: 429 R Axis:   66  Text Interpretation: Normal sinus rhythm Normal ECG When compared with ECG of 25-Nov-2021 12:34, Premature ventricular complexes are no longer Present Confirmed by Duke Salvia,  Viera Okonski ([id number]) on 03/27/2023 2:57:12 PM   Coronary intervention 11/2021:   Ost LAD to Prox LAD lesion is 75% stenosed.   Mid LAD lesion is 70% stenosed.   Mid Cx lesion is 40% stenosed.   A drug-eluting stent was successfully placed using a SYNERGY XD 2.50X32.   A drug-eluting stent was successfully placed using a SYNERGY XD 3.0X12.   Post intervention, there is a 0% residual stenosis.   Post intervention, there is a 0% residual stenosis.    Successful orbital atherectomy and stenting of severe calcific tandem stenoses in the proximal and mid LAD using a CSI 1.25 mm classic crown followed by a 2.5 x 32 mm Synergy DES overlapped proximally with a 3.0 x 12 mm Synergy DES   Same-day PCI protocol if criteria met.  Dual antiplatelet therapy with aspirin and clopidogrel favor 12 months of therapy in the setting of the overlapping DES in the proximal/mid LAD.  Risk Assessment/Calculations:         Physical Exam:    VS:  BP 122/76   Pulse 69   Ht 5' (1.524 m)   Wt 102 lb 6.4 oz (46.4 kg)   SpO2 96%   BMI 20.00 kg/m  , BMI Body mass index is 20 kg/m. GENERAL:  Well appearing HEENT: Pupils equal round and reactive, fundi not visualized, oral mucosa unremarkable NECK:  No jugular venous distention, waveform within normal limits, carotid upstroke brisk and symmetric, no bruits, no thyromegaly LUNGS:  Clear to auscultation bilaterally HEART:  RRR.  PMI not displaced or sustained,S1 and S2 within normal limits, no S3, no S4, no clicks, no rubs, no murmurs ABD:  Flat, positive bowel sounds normal in frequency in pitch, no bruits, no rebound, no guarding, no midline pulsatile mass, no hepatomegaly, no splenomegaly EXT:  2 plus pulses throughout, no edema, no cyanosis no clubbing SKIN:  No rashes no nodules NEURO:  Cranial nerves II through XII grossly intact, motor grossly intact throughout PSYCH:  Cognitively intact, oriented to Bryan place and time   ASSESSMENT AND PLAN: .    # Hyperlipidemia: Continue pravastatin.  # Chronic Obstructive Pulmonary Disease (COPD) Increased dyspnea with exertion and wheezing, particularly at night. Patient is not regularly using her albuterol inhaler due to side effects. Lung sounds are tight on examination. -Recommend starting a daily maintenance inhaler for COPD. -Refer to pulmonology for further evaluation and management, including possible lung function testing.  # Anxiety Patient reports  increased anxiety, contributing to feelings of chest heaviness and dyspnea. Recently started on Abilify. -Continue current management under mental health provider.  # Coronary Artery Disease (CAD) Patient had PCI last year. No current chest pain. -Continue aspirin, metoprolol, and pravastatin. -Ensure cholesterol levels are checked by primary care provider.  # Tobacco Use Patient continues to smoke, though less frequently on less stressful days. -Encourage continued efforts to quit smoking.  Follow-up in 6 months to assess lung function and overall health status.       Signed, Chilton Si, MD

## 2023-03-27 NOTE — Patient Instructions (Addendum)
Medication Instructions:  Your physician recommends that you continue on your current medications as directed. Please refer to the Current Medication list given to you today.  *If you need a refill on your cardiac medications before your next appointment, please call your pharmacy*   Lab Work: Please have your primary care send your Fasting Cholesterol labs to [phone]  Follow-Up: At [facility], you and your health needs are our priority.  As part of our continuing mission to provide you with exceptional heart care, we have created designated Provider Care Teams.  These Care Teams include your primary Cardiologist (physician) and Advanced Practice Providers (APPs -  Physician Assistants and Nurse Practitioners) who all work together to provide you with the care you need, when you need it.  We recommend signing up for the patient portal called "MyChart".  Sign up information is provided on this After Visit Summary.  MyChart is used to connect with patients for Virtual Visits (Telemedicine).  Patients are able to view lab/test results, encounter notes, upcoming appointments, etc.  Non-urgent messages can be sent to your provider as well.   To learn more about what you can do with MyChart, go to [email or web address].    Your next appointment:   6 months with Dr. Duke Salvia   Other Instructions Please consider referral to pulmonology for further lung workup

## 2023-03-31 ENCOUNTER — Telehealth: Payer: Self-pay | Admitting: Cardiovascular Disease

## 2023-03-31 NOTE — Telephone Encounter (Signed)
Pt is calling because she cannot see ov note in MyChart. She is wanting to be able to look at it when she calls her PCP so she can discuss Pulmonary Referral. Please advise

## 2023-03-31 NOTE — Telephone Encounter (Signed)
Returned call to patient, no answer at this time.

## 2023-04-04 ENCOUNTER — Other Ambulatory Visit (HOSPITAL_BASED_OUTPATIENT_CLINIC_OR_DEPARTMENT_OTHER): Payer: Self-pay | Admitting: Family

## 2023-04-04 DIAGNOSIS — I25118 Atherosclerotic heart disease of native coronary artery with other forms of angina pectoris: Secondary | ICD-10-CM

## 2023-04-04 DIAGNOSIS — E785 Hyperlipidemia, unspecified: Secondary | ICD-10-CM

## 2023-04-08 ENCOUNTER — Ambulatory Visit: Payer: PRIVATE HEALTH INSURANCE

## 2023-04-10 IMAGING — CT CT HEART MORP W/ CTA COR W/ SCORE W/ CA W/CM &/OR W/O CM
4 of 7 series · 8 of 20 positions shown, 9 images · non-contrast
Comparison: None available.

Addendum:
CLINICAL DATA: Chest pain

EXAM:
Cardiac/Coronary CTA
TECHNIQUE: A non-contrast, gated CT scan was obtained with axial slices of 3 mm
through the heart for calcium scoring. Calcium scoring was performed
using the Agatston method. A 120 kV prospective, gated, contrast
cardiac scan was obtained. Gantry rotation speed was 250 msecs and
collimation was 0.6 mm. Two sublingual nitroglycerin tablets (0.8
mg) were given. The 3D data set was reconstructed in 5% intervals of
the 35-75% of the R-R cycle. Diastolic phases were analyzed on a
dedicated workstation using MPR, MIP, and VRT modes. The patient
received 95 cc of contrast.
CLINICAL DATA: This over-read does not include interpretation of
cardiac or coronary anatomy or pathology. The coronary CTA
interpretation by the cardiologist is attached.

[Series 6: ts diast sharp · axial · 0.39mm/px · z∈[+1178,+1215]mm · 2 of 278 slices shown]
[im 93/278  lung]
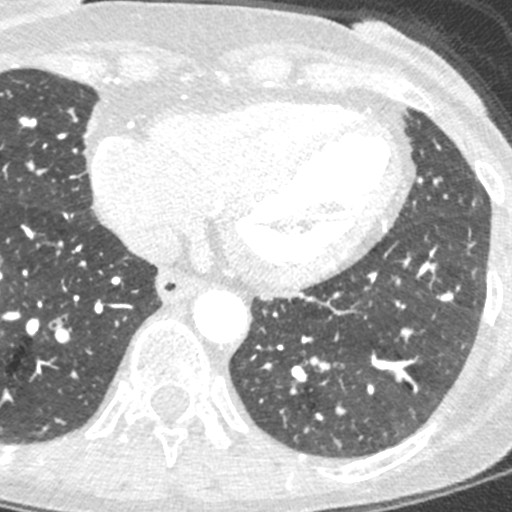
[im 185/278  lung]
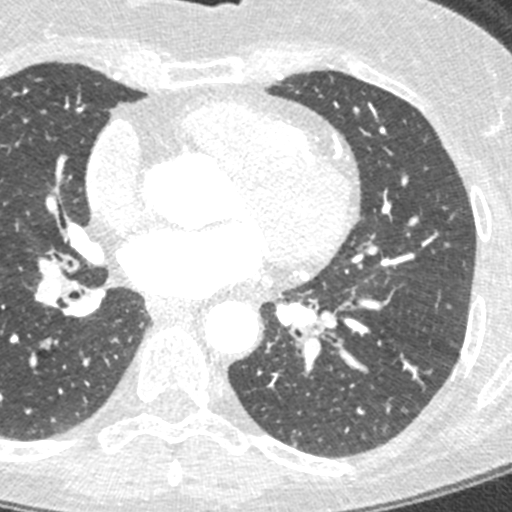

[Series 7: best diast · axial · 0.39mm/px · z∈[+1178,+1215]mm · 2 of 278 slices shown, 3 images]
[im 93/278  vessel]
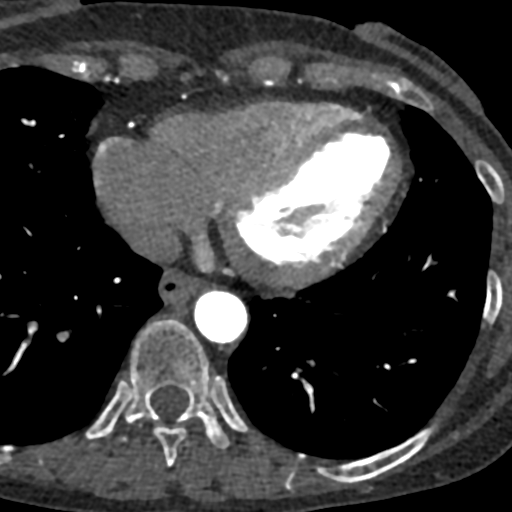
[im 93/278  lung]
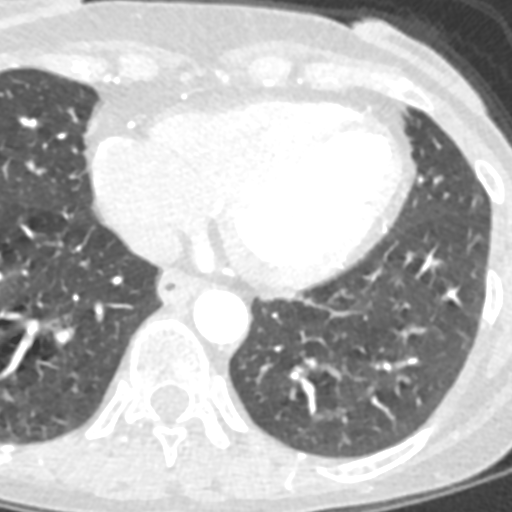
[im 185/278  vessel]
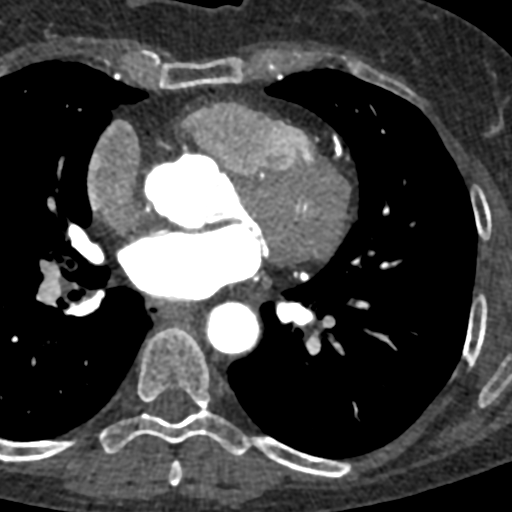

[Series 8: ts syst sharp · axial · 0.39mm/px · z∈[+1178,+1215]mm · 2 of 278 slices shown]
[im 93/278  lung]
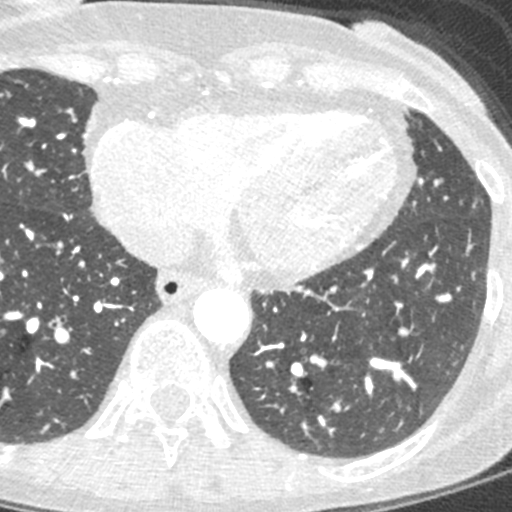
[im 185/278  lung]
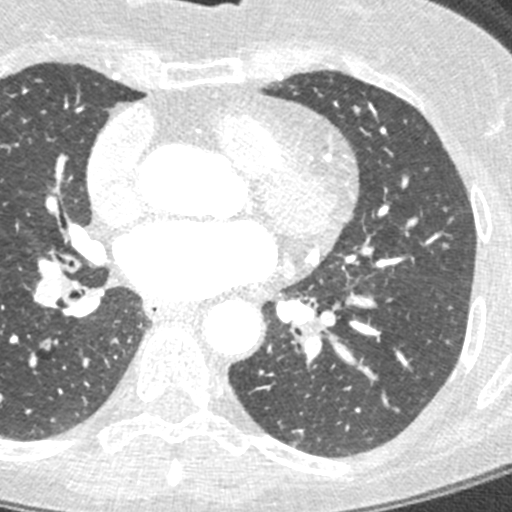

[Series 9: best syst · axial · 0.39mm/px · z∈[+1178,+1215]mm · 2 of 278 slices shown]
[im 93/278  vessel]
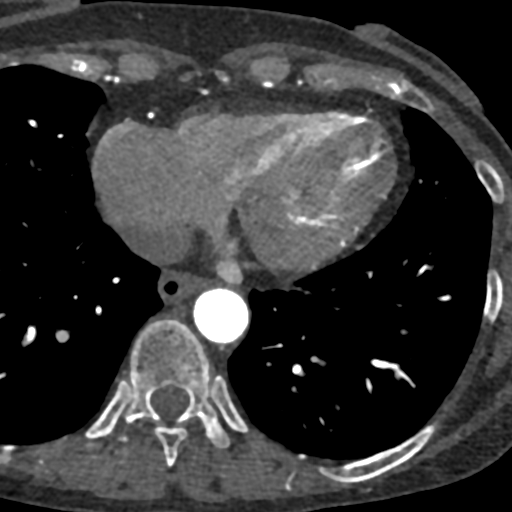
[im 185/278  vessel]
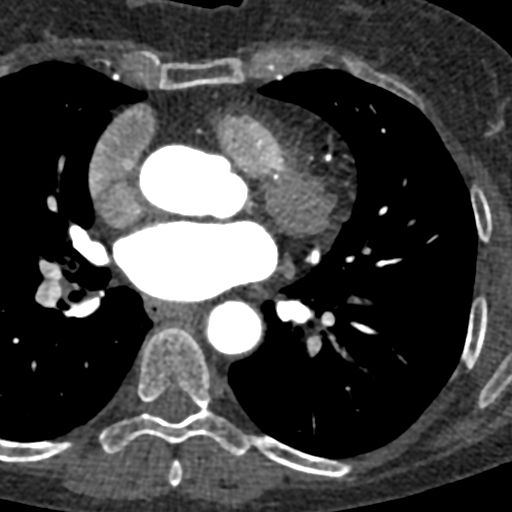

[8 of 20 positions shown; findings below may reference images not displayed]

FINDINGS: Image quality: Excellent.

Noise artifact is: Limited.

Coronary Arteries:  Normal coronary origin.  Right dominance.

Left main: The left main is a large caliber vessel with a normal
take off from the left coronary cusp that bifurcates to form a left
anterior descending artery and a left circumflex artery. There is no
plaque or stenosis.

Left anterior descending artery: The LAD gives off 2 patent diagonal
branches. There is moderate calcified plaque in the proximal and mid
LAD with associated stenosis of 50-69%. There is mild calcified
plaque in the distal LAD with associated stenosis of 25-49%.

Left circumflex artery: The LCX is non-dominant and gives off a
large trifurcating OM1. There is minimal calcified plaque in the
proximal LCx with associated stenosis of < 25%. There is mild
calcified plaque in the proximal to mid LCx extending into the
ostium of a large trifurcating OM1 with associated stenosis of
25-49%.

Right coronary artery: The RCA is dominant with normal take off from
the right coronary cusp. There is no evidence of plaque or stenosis.
The RCA terminates as a PDA and right posterolateral branch without
evidence of plaque or stenosis.

Right Atrium: Right atrial size is within normal limits.

Right Ventricle: The right ventricular cavity is within normal
limits.

Left Atrium: Left atrial size is normal in size with no left atrial
appendage filling defect.

Left Ventricle: The ventricular cavity size is within normal limits.
There are no stigmata of prior infarction. There is no abnormal
filling defect.

Pulmonary arteries: Normal in size without proximal filling defect.

Pulmonary veins: Normal pulmonary venous drainage.

Pericardium: Normal thickness with no significant effusion or
calcium present.

Cardiac valves: The aortic valve is trileaflet without significant
calcification. The mitral valve is normal structure without
significant calcification.

Aorta: Normal caliber with no significant disease.

Small PFO present.

Extra-cardiac findings: See attached radiology report for
non-cardiac structures.
IMPRESSION: 1. Coronary calcium score of 415. This was 97th percentile for age-,
sex, and race-matched controls.

2.  Normal coronary origin with right dominance.

3.  Moderate atherosclerosis of the LAD 50-69%.  CAD RADS 3.

4. Consider symptom-guided anti-ischemic and preventive
pharmacotherapy as well as risk factor modification per
guideline-directed care.

5.  This study has been submitted for FFR analysis.

RECOMMENDATIONS:
1. CAD-RADS 0: No evidence of CAD (0%). Consider non-atherosclerotic
causes of chest pain.

2. CAD-RADS 1: Minimal non-obstructive CAD (0-24%). Consider
non-atherosclerotic causes of chest pain. Consider preventive
therapy and risk factor modification.

3. CAD-RADS 2: Mild non-obstructive CAD (25-49%). Consider
non-atherosclerotic causes of chest pain. Consider preventive
therapy and risk factor modification.

4. CAD-RADS 3: Moderate stenosis. Consider symptom-guided
anti-ischemic pharmacotherapy as well as risk factor modification
per guideline directed care. Additional analysis with CT FFR will be
submitted.

5. CAD-RADS 4: Severe stenosis. (70-99% or > 50% left main). Cardiac
catheterization or CT FFR is recommended. Consider symptom-guided
anti-ischemic pharmacotherapy as well as risk factor modification
per guideline directed care. Invasive coronary angiography
recommended with revascularization per published guideline
statements.

6. CAD-RADS 5: Total coronary occlusion (100%). Consider cardiac
catheterization or viability assessment. Consider symptom-guided
anti-ischemic pharmacotherapy as well as risk factor modification
per guideline directed care.

7. CAD-RADS N: Non-diagnostic study. Obstructive CAD can't be
excluded. Alternative evaluation is recommended.

. The  interpretation by the cardiologist is attached.
FINDINGS: No suspicious nodules, masses, or infiltrates are identified in the
visualized portion of the lungs. No pleural fluid seen.

The visualized portions of the mediastinum and chest wall are
unremarkable.
IMPRESSION: No significant non-cardiac abnormality identified.

*** End of Addendum ***
FINDINGS: Image quality: Excellent.

Noise artifact is: Limited.

Coronary Arteries:  Normal coronary origin.  Right dominance.

Left main: The left main is a large caliber vessel with a normal
take off from the left coronary cusp that bifurcates to form a left
anterior descending artery and a left circumflex artery. There is no
plaque or stenosis.

Left anterior descending artery: The LAD gives off 2 patent diagonal
branches. There is moderate calcified plaque in the proximal and mid
LAD with associated stenosis of 50-69%. There is mild calcified
plaque in the distal LAD with associated stenosis of 25-49%.

Left circumflex artery: The LCX is non-dominant and gives off a
large trifurcating OM1. There is minimal calcified plaque in the
proximal LCx with associated stenosis of < 25%. There is mild
calcified plaque in the proximal to mid LCx extending into the
ostium of a large trifurcating OM1 with associated stenosis of
25-49%.

Right coronary artery: The RCA is dominant with normal take off from
the right coronary cusp. There is no evidence of plaque or stenosis.
The RCA terminates as a PDA and right posterolateral branch without
evidence of plaque or stenosis.

Right Atrium: Right atrial size is within normal limits.

Right Ventricle: The right ventricular cavity is within normal
limits.

Left Atrium: Left atrial size is normal in size with no left atrial
appendage filling defect.

Left Ventricle: The ventricular cavity size is within normal limits.
There are no stigmata of prior infarction. There is no abnormal
filling defect.

Pulmonary arteries: Normal in size without proximal filling defect.

Pulmonary veins: Normal pulmonary venous drainage.

Pericardium: Normal thickness with no significant effusion or
calcium present.

Cardiac valves: The aortic valve is trileaflet without significant
calcification. The mitral valve is normal structure without
significant calcification.

Aorta: Normal caliber with no significant disease.

Small PFO present.

Extra-cardiac findings: See attached radiology report for
non-cardiac structures.
IMPRESSION: 1. Coronary calcium score of 415. This was 97th percentile for age-,
sex, and race-matched controls.

2.  Normal coronary origin with right dominance.

3.  Moderate atherosclerosis of the LAD 50-69%.  CAD RADS 3.

4. Consider symptom-guided anti-ischemic and preventive
pharmacotherapy as well as risk factor modification per
guideline-directed care.

5.  This study has been submitted for FFR analysis.

RECOMMENDATIONS:
1. CAD-RADS 0: No evidence of CAD (0%). Consider non-atherosclerotic
causes of chest pain.

2. CAD-RADS 1: Minimal non-obstructive CAD (0-24%). Consider
non-atherosclerotic causes of chest pain. Consider preventive
therapy and risk factor modification.

3. CAD-RADS 2: Mild non-obstructive CAD (25-49%). Consider
non-atherosclerotic causes of chest pain. Consider preventive
therapy and risk factor modification.

4. CAD-RADS 3: Moderate stenosis. Consider symptom-guided
anti-ischemic pharmacotherapy as well as risk factor modification
per guideline directed care. Additional analysis with CT FFR will be
submitted.

5. CAD-RADS 4: Severe stenosis. (70-99% or > 50% left main). Cardiac
catheterization or CT FFR is recommended. Consider symptom-guided
anti-ischemic pharmacotherapy as well as risk factor modification
per guideline directed care. Invasive coronary angiography
recommended with revascularization per published guideline
statements.

6. CAD-RADS 5: Total coronary occlusion (100%). Consider cardiac
catheterization or viability assessment. Consider symptom-guided
anti-ischemic pharmacotherapy as well as risk factor modification
per guideline directed care.

7. CAD-RADS N: Non-diagnostic study. Obstructive CAD can't be
excluded. Alternative evaluation is recommended.

. The  interpretation by the cardiologist is attached.

## 2023-04-26 ENCOUNTER — Encounter (HOSPITAL_BASED_OUTPATIENT_CLINIC_OR_DEPARTMENT_OTHER): Payer: Self-pay | Admitting: Cardiovascular Disease

## 2023-05-01 ENCOUNTER — Ambulatory Visit: Payer: PRIVATE HEALTH INSURANCE

## 2023-05-15 DIAGNOSIS — Z9229 Personal history of other drug therapy: Secondary | ICD-10-CM | POA: Diagnosis not present

## 2023-09-18 ENCOUNTER — Ambulatory Visit: Payer: 59 | Admitting: Physician Assistant

## 2023-10-09 ENCOUNTER — Other Ambulatory Visit: Payer: Self-pay

## 2023-10-09 MED ORDER — METOPROLOL SUCCINATE ER 25 MG PO TB24: 25.0000 mg | ORAL_TABLET | Freq: Every evening | ORAL | 1 refills | Status: DC

## 2023-10-12 ENCOUNTER — Other Ambulatory Visit (HOSPITAL_BASED_OUTPATIENT_CLINIC_OR_DEPARTMENT_OTHER): Payer: Self-pay | Admitting: *Deleted

## 2023-10-12 ENCOUNTER — Telehealth: Payer: Self-pay | Admitting: Cardiovascular Disease

## 2023-10-12 DIAGNOSIS — E785 Hyperlipidemia, unspecified: Secondary | ICD-10-CM

## 2023-10-12 DIAGNOSIS — I25118 Atherosclerotic heart disease of native coronary artery with other forms of angina pectoris: Secondary | ICD-10-CM

## 2023-10-12 MED ORDER — PRAVASTATIN SODIUM 20 MG PO TABS: 20.0000 mg | ORAL_TABLET | Freq: Every day | ORAL | 2 refills | Status: DC

## 2023-10-12 MED ORDER — METOPROLOL SUCCINATE ER 25 MG PO TB24: 25.0000 mg | ORAL_TABLET | Freq: Every evening | ORAL | 2 refills | Status: DC

## 2023-10-12 NOTE — Telephone Encounter (Signed)
*  STAT* If patient is at the pharmacy, call can be transferred to refill team.   1. Which medications need to be refilled? (please list name of each medication and dose if known) metoprolol  succinate (TOPROL  XL) 25 MG 24 hr tablet  Take 1 tablet (25 mg total) by mouth every evening.  pravastatin  (PRAVACHOL ) 20 MG tablet  TAKE (1) TABLET BY MOUTH AT BEDTIME.   2. Would you like to learn more about the convenience, safety, & potential cost savings by using the [facility]? No     3. Are you open to using the [facility] No   4. Which pharmacy/location (including street and city if local pharmacy) is medication to be sent to?[facility] - [place], [place] - [id number]   5. Do they need a 30 day or 90 day supply? 90 Day Supply

## 2023-10-13 ENCOUNTER — Ambulatory Visit (HOSPITAL_BASED_OUTPATIENT_CLINIC_OR_DEPARTMENT_OTHER): Payer: 59 | Admitting: Family

## 2024-01-06 ENCOUNTER — Ambulatory Visit: Admitting: Family Medicine

## 2024-01-11 ENCOUNTER — Ambulatory Visit (HOSPITAL_BASED_OUTPATIENT_CLINIC_OR_DEPARTMENT_OTHER): Admitting: Family

## 2024-02-22 ENCOUNTER — Ambulatory Visit (HOSPITAL_COMMUNITY): Admitting: Registered Nurse

## 2024-03-14 ENCOUNTER — Encounter (HOSPITAL_BASED_OUTPATIENT_CLINIC_OR_DEPARTMENT_OTHER): Payer: Self-pay

## 2024-03-15 ENCOUNTER — Ambulatory Visit (HOSPITAL_BASED_OUTPATIENT_CLINIC_OR_DEPARTMENT_OTHER): Admitting: Family

## 2024-03-15 ENCOUNTER — Encounter (HOSPITAL_BASED_OUTPATIENT_CLINIC_OR_DEPARTMENT_OTHER): Payer: Self-pay | Admitting: Family

## 2024-03-15 VITALS — BP 102/68 | HR 73 | Ht 60.0 in | Wt 111.4 lb

## 2024-03-15 DIAGNOSIS — I25118 Atherosclerotic heart disease of native coronary artery with other forms of angina pectoris: Secondary | ICD-10-CM

## 2024-03-15 DIAGNOSIS — E785 Hyperlipidemia, unspecified: Secondary | ICD-10-CM

## 2024-03-15 DIAGNOSIS — I10 Essential (primary) hypertension: Secondary | ICD-10-CM | POA: Diagnosis not present

## 2024-03-15 DIAGNOSIS — R5383 Other fatigue: Secondary | ICD-10-CM | POA: Diagnosis not present

## 2024-03-15 MED ORDER — ASPIRIN 81 MG PO TBEC: 81.0000 mg | DELAYED_RELEASE_TABLET | Freq: Every day | ORAL | 1 refills | Status: AC

## 2024-03-15 MED ORDER — METOPROLOL SUCCINATE ER 25 MG PO TB24: 25.0000 mg | ORAL_TABLET | Freq: Every evening | ORAL | 2 refills | Status: AC

## 2024-03-15 MED ORDER — PRAVASTATIN SODIUM 20 MG PO TABS: 20.0000 mg | ORAL_TABLET | Freq: Every day | ORAL | 2 refills | Status: DC

## 2024-03-15 NOTE — Patient Instructions (Signed)
 Medication Instructions:  Continue your current medications.   *If you need a refill on your cardiac medications before your next appointment, please call your pharmacy*  Lab Work: Your physician recommends that you return for lab work within the next 1-2 weeks for fasting labs: cholesterol, CMET, CBC, TSH If you have labs (blood work) drawn today and your tests are completely normal, you will receive your results only by: MyChart Message (if you have MyChart) OR A paper copy in the mail If you have any lab test that is abnormal or we need to change your treatment, we will call you to review the results.  Testing/Procedures: Your EKG today looked good!  Follow-Up: At [facility], you and your health needs are our priority.  As part of our continuing mission to provide you with exceptional heart care, our providers are all part of one team.  This team includes your primary Cardiologist (physician) and Advanced Practice Providers or APPs (Physician Assistants and Nurse Practitioners) who all work together to provide you with the care you need, when you need it.  Your next appointment:   6 month(s)  Provider:   Annabella Scarce, MD, Rosaline Bane, NP, or Reche Finder, NP    We recommend signing up for the patient portal called MyChart.  Sign up information is provided on this After Visit Summary.  MyChart is used to connect with patients for Virtual Visits (Telemedicine).  Patients are able to view lab/test results, encounter notes, upcoming appointments, etc.  Non-urgent messages can be sent to your provider as well.   To learn more about what you can do with MyChart, go to [email or web address].   Other Instructions

## 2024-03-15 NOTE — Progress Notes (Signed)
 Cardiology Office Note   Date:  03/15/2024  ID:  Wendy Bryan, DOB [birth year], MRN [id number] PCP: Grooms, Charmaine RIGGERS  [facility] Providers Cardiologist:  Annabella Scarce, MD     History of Present Illness Wendy Bryan is a 63 y.o. female  with a hx of CAD (11/25/21 orbital atherectomy and stenting of severe calcific tandem stenoses in proximal and mid LAD with DES X2), tobacco use, hyperlipidemia, COPD .   Seen in consult by Dr. Scarce 10/15/2021 due to chest pain recommended for cardiac CTA with calcium  score 415 placing her in the 97th percentile with flow limiting lesion in LAD by FFR.  Diagnostic cath 11/13/2021 patent left main, left circumflex, RCA with mild nonobstructive plaque of the left circumflex but severe calcific stenosis of proximal LAD and moderate to severe focal stenosis of mid LAD hemodynamically significant by RFR and FFR.  She was started on Plavix .  Return for staged orbital atherectomy and DES X2 to LAD 11/13/2021.  Recommend for DAPT aspirin /Plavix  for at least 12 months.  Metoprolol  later reduced due to fatigue.  Last seen 03/27/2023 by Dr. Scarce.  She had increase anxiety and persistent shortness of breath.  She was encouraged to consider referral to pulmonology for further treatment of her COPD.  No changes were made from a cardiac perspective.   Presents today for follow up. She was recently treated for pneumonia with antibiotics and steroids, is feeling better but breathing not yet at baseline. She does not endorse increased use of PRN albuterol . She denies chest pain, lightheadedness, dizziness, edema. Notes she fatigues easily with decreased energy, which she attributes to depression. Reports heart racing associated with panic attacks.   Her father passed away since her last visit. She had been his primary caregiver.   Her PCP retired and she is establishing with a new primary provider in November.   ROS: Please see the history of present  illness.    All other systems reviewed and are negative.   Studies Reviewed EKG Interpretation Date/Time:  Tuesday March 15 2024 13:34:19 EDT Ventricular Rate:  73 PR Interval:  140 QRS Duration:  72 QT Interval:  394 QTC Calculation: 434 R Axis:   84  Text Interpretation: Normal sinus rhythm Normal ECG Confirmed by Vannie Mora ([id number]) on 03/15/2024 1:49:31 PM    Cardiac Studies & Procedures   ______________________________________________________________________________________________ CARDIAC CATHETERIZATION  CARDIAC CATHETERIZATION 11/25/2021  Conclusion   Ost LAD to Prox LAD lesion is 75% stenosed.   Mid LAD lesion is 70% stenosed.   Mid Cx lesion is 40% stenosed.   A drug-eluting stent was successfully placed using a SYNERGY XD 2.50X32.   A drug-eluting stent was successfully placed using a SYNERGY XD 3.0X12.   Post intervention, there is a 0% residual stenosis.   Post intervention, there is a 0% residual stenosis.  Successful orbital atherectomy and stenting of severe calcific tandem stenoses in the proximal and mid LAD using a CSI 1.25 mm classic crown followed by a 2.5 x 32 mm Synergy DES overlapped proximally with a 3.0 x 12 mm Synergy DES  Same-day PCI protocol if criteria met.  Dual antiplatelet therapy with aspirin  and clopidogrel  favor 12 months of therapy in the setting of the overlapping DES in the proximal/mid LAD.  Findings Coronary Findings Diagnostic  Dominance: Right  Left Main The left main is patent with no significant stenosis  Left Anterior Descending Ost LAD to Prox LAD lesion is 75% stenosed. Diffuse calcific 75% stenosis Mid  LAD lesion is 70% stenosed. The lesion is moderately calcified. Focal lesion with moderate calcification  Left Circumflex Mid Cx lesion is 40% stenosed. Circumflex is patent with mild 40% stenosis in the mid vessel  Right Coronary Artery Patent vessel with no significant stenosis  Intervention  Ost LAD to Prox  LAD lesion Atherectomy Stent A drug-eluting stent was successfully placed using a SYNERGY XD 3.0X12. Post-stent angioplasty was performed using a BALLN [place] EUPHORA RX 2.75X20. Maximum pressure:  20 atm. Post-Intervention Lesion Assessment The intervention was successful. Pre-interventional TIMI flow is 3. Post-intervention TIMI flow is 3. No complications occurred at this lesion. There is a 0% residual stenosis post intervention.  Mid LAD lesion Atherectomy Stent A drug-eluting stent was successfully placed using a SYNERGY XD 2.50X32. Stent strut is well apposed. Post-stent angioplasty was performed using a BALLN [place] EUPHORA RX 2.75X20. Maximum pressure:  18 atm. Post-Intervention Lesion Assessment The intervention was successful. Pre-interventional TIMI flow is 3. Post-intervention TIMI flow is 3. No complications occurred at this lesion. There is a 0% residual stenosis post intervention.   CARDIAC CATHETERIZATION  CARDIAC CATHETERIZATION 11/13/2021  Conclusion 1.  Patent left main, left circumflex, and RCA with mild nonobstructive plaquing of the left circumflex 2.  Severe calcific stenosis of the proximal LAD and moderately severe focal stenosis of the mid LAD, hemodynamically significant by RFR of 0.89 and FFR of 0.78 with IV adenosine   Recommendations: Staged PCI of the LAD.  This procedure will require orbital atherectomy which could not be performed through this patient's right radial artery access because the vessel was too small to accommodate a 6 French sheath or catheter.  We will start her on clopidogrel  and bring her back for orbital atherectomy, PTCA, and stenting of the LAD.  She will need both the proximal LAD and mid LAD lesions treated.  Plan discussed with the patient and her husband who are in agreement.  Clopidogrel  was called into her local pharmacy.  Findings Coronary Findings Diagnostic  Dominance: Right  Left Main The left main is patent with no significant  stenosis  Left Anterior Descending Ost LAD to Prox LAD lesion is 75% stenosed. Diffuse calcific 75% stenosis Mid LAD lesion is 70% stenosed. The lesion is moderately calcified. Focal lesion with moderate calcification  Left Circumflex Mid Cx lesion is 40% stenosed. Circumflex is patent with mild 40% stenosis in the mid vessel  Right Coronary Artery Patent vessel with no significant stenosis  Intervention  No interventions have been documented.          CT SCANS  CT CORONARY FRACTIONAL FLOW RESERVE DATA PREP 11/06/2021  Narrative EXAM: FFRCT ANALYSIS  FINDINGS: FFRct analysis was performed on the original cardiac CT angiogram dataset. Diagrammatic representation of the FFRct analysis is provided in a separate PDF document in PACS. This dictation was created using the PDF document and an interactive 3D model of the results. 3D model is not available in the EMR/PACS. Normal FFR range is >0.80.  1. Left Main: LM FFR = 1.00.  2. LAD: Proximal FFR = 0.99, Mid FFR = 0.84, Distal FFR = 0.63.  3. LCX: Proximal FFR = 0.99, Mid FFR = 0.91, Distal FFR = 0.88.  4. RCA: Proximal FFR = 0.99, Mid FFR = 0.93. Distal RCA was not modeled.  IMPRESSION: IMPRESSION  1. Coronary CTA FFR analysis demonstrates possible flow limiting lesion in the mid to distal LAD (prox FFR 0.99>>0.84>>distal FFR 0.63. Coronary CTA demonstrated a 50-69% lesion in the mid LAD and  25-49% lesion in the distal LAD.  2.  Consider cardiac catheterization.  Wilbert Bihari, MD   Electronically Signed By: Wilbert Bihari M.D. On: 11/06/2021 22:24   CT SCANS  CT CORONARY MORPH W/CTA COR W/SCORE 11/05/2021  Addendum 11/05/2021  4:12 PM ADDENDUM REPORT: 11/05/2021 16:09  CLINICAL DATA:  This over-read does not include interpretation of cardiac or coronary anatomy or pathology. The coronary CTA interpretation by the cardiologist is attached.  COMPARISON:  None available.  FINDINGS: No suspicious  nodules, masses, or infiltrates are identified in the visualized portion of the lungs. No pleural fluid seen.  The visualized portions of the mediastinum and chest wall are unremarkable.  IMPRESSION: No significant non-cardiac abnormality identified.   Electronically Signed By: Norleen DELENA Kil M.D. On: 11/05/2021 16:09  Narrative CLINICAL DATA:  Chest pain  EXAM: Cardiac/Coronary CTA  TECHNIQUE: A non-contrast, gated CT scan was obtained with axial slices of 3 mm through the heart for calcium  scoring. Calcium  scoring was performed using the Agatston method. A 120 kV prospective, gated, contrast cardiac scan was obtained. Gantry rotation speed was 250 msecs and collimation was 0.6 mm. Two sublingual nitroglycerin  tablets (0.8 mg) were given. The 3D data set was reconstructed in 5% intervals of the 35-75% of the R-R cycle. Diastolic phases were analyzed on a dedicated workstation using MPR, MIP, and VRT modes. The patient received 95 cc of contrast.  FINDINGS: Image quality: Excellent.  Noise artifact is: Limited.  Coronary Arteries:  Normal coronary origin.  Right dominance.  Left main: The left main is a large caliber vessel with a normal take off from the left coronary cusp that bifurcates to form a left anterior descending artery and a left circumflex artery. There is no plaque or stenosis.  Left anterior descending artery: The LAD gives off 2 patent diagonal branches. There is moderate calcified plaque in the proximal and mid LAD with associated stenosis of 50-69%. There is mild calcified plaque in the distal LAD with associated stenosis of 25-49%.  Left circumflex artery: The LCX is non-dominant and gives off a large trifurcating OM1. There is minimal calcified plaque in the proximal LCx with associated stenosis of < 25%. There is mild calcified plaque in the proximal to mid LCx extending into the ostium of a large trifurcating OM1 with associated stenosis  of 25-49%.  Right coronary artery: The RCA is dominant with normal take off from the right coronary cusp. There is no evidence of plaque or stenosis. The RCA terminates as a PDA and right posterolateral branch without evidence of plaque or stenosis.  Right Atrium: Right atrial size is within normal limits.  Right Ventricle: The right ventricular cavity is within normal limits.  Left Atrium: Left atrial size is normal in size with no left atrial appendage filling defect.  Left Ventricle: The ventricular cavity size is within normal limits. There are no stigmata of prior infarction. There is no abnormal filling defect.  Pulmonary arteries: Normal in size without proximal filling defect.  Pulmonary veins: Normal pulmonary venous drainage.  Pericardium: Normal thickness with no significant effusion or calcium  present.  Cardiac valves: The aortic valve is trileaflet without significant calcification. The mitral valve is normal structure without significant calcification.  Aorta: Normal caliber with no significant disease.  Small PFO present.  Extra-cardiac findings: See attached radiology report for non-cardiac structures.  IMPRESSION: 1. Coronary calcium  score of 415. This was 97th percentile for age-, sex, and race-matched controls.  2.  Normal coronary origin with right dominance.  3.  Moderate atherosclerosis of the LAD 50-69%.  CAD RADS 3.  4. Consider symptom-guided anti-ischemic and preventive pharmacotherapy as well as risk factor modification per guideline-directed care.  5.  This study has been submitted for FFR analysis.  RECOMMENDATIONS: 1. CAD-RADS 0: No evidence of CAD (0%). Consider non-atherosclerotic causes of chest pain.  2. CAD-RADS 1: Minimal non-obstructive CAD (0-24%). Consider non-atherosclerotic causes of chest pain. Consider preventive therapy and risk factor modification.  3. CAD-RADS 2: Mild non-obstructive CAD (25-49%).  Consider non-atherosclerotic causes of chest pain. Consider preventive therapy and risk factor modification.  4. CAD-RADS 3: Moderate stenosis. Consider symptom-guided anti-ischemic pharmacotherapy as well as risk factor modification per guideline directed care. Additional analysis with CT FFR will be submitted.  5. CAD-RADS 4: Severe stenosis. (70-99% or > 50% left main). Cardiac catheterization or CT FFR is recommended. Consider symptom-guided anti-ischemic pharmacotherapy as well as risk factor modification per guideline directed care. Invasive coronary angiography recommended with revascularization per published guideline statements.  6. CAD-RADS 5: Total coronary occlusion (100%). Consider cardiac catheterization or viability assessment. Consider symptom-guided anti-ischemic pharmacotherapy as well as risk factor modification per guideline directed care.  7. CAD-RADS N: Non-diagnostic study. Obstructive CAD can't be excluded. Alternative evaluation is recommended.  Wilbert Bihari, MD  . The  interpretation by the cardiologist is attached.  Electronically Signed: By: Wilbert Bihari M.D. On: 11/05/2021 15:54     ______________________________________________________________________________________________      Risk Assessment/Calculations           Physical Exam VS:  BP 102/68   Pulse 73   Ht 5' (1.524 m)   Wt 111 lb 6.4 oz (50.5 kg)   SpO2 96%   BMI 21.76 kg/m        Wt Readings from Last 3 Encounters:  03/15/24 111 lb 6.4 oz (50.5 kg)  03/27/23 102 lb 6.4 oz (46.4 kg)  03/20/23 106 lb (48.1 kg)    GEN: Well nourished, well developed in no acute distress NECK: No JVD; No carotid bruits CARDIAC: RRR, no murmurs, rubs, gallops; radial pulses 2+ bilaterally RESPIRATORY:  Clear to auscultation without rales, wheezing or rhonchi  ABDOMEN: Soft, non-tender, non-distended EXTREMITIES:  No edema; No deformity   ASSESSMENT AND PLAN  CAD -DES x2 to LAD 11/2021 -  stable with no anginal symptoms. Reassuring EKG today with SR @73  bpm; GDMT: aspirin  81mg  daily, metoprolol  succinate 25mg  daily, pravastatin  20mg  daily. Refills provided. Continue therapy as above Encouraged regular physical exercise, aiming for 150 minutes of moderate intensity exercise weekly Encouraged heart health diet Commended on decreasing tobacco use   Fatigue - ongoing for >6 weeks, ?depression v. thyroid  with history of fluctuating TSH; less likely anginal equivalent in absence of other typical symptoms Will check CBC and TSH today Further management pending results  HLD, LDL goal less than 70 - labs 10/2022: TC 168, LDL 76, TGs 108, HDL 73; on pravastatin  20mg  daily Continue therapy as above Fasting lipid panel and CMET ordered today, to be collected within the next 2 weeks Consider increasing pravastatin  dose if LDL not to target   Tobacco use / COPD - commended on decreasing tobccaso use; discussed possible referral to pulmonologist for management of COPD, she will update if this is desired.   Hypothyroidism - 01/2023: TSH 1.07; reports fatigue and poor sleep Will repeat TSH today, defer further management to PCP   Anxiety / Chronic pain - Discussed use of PRN xanax and hydroxizne with panic attacks and tachycardia. Continue to follow  with PCP.         Dispo: 6 months  Signed, Reche GORMAN Finder, NP

## 2024-04-04 ENCOUNTER — Ambulatory Visit: Admitting: Physician Assistant

## 2024-04-28 LAB — CBC
Hematocrit: 44.1 % (ref 34.0–46.6)
Hemoglobin: 14.5 g/dL (ref 11.1–15.9)
MCH: 29.8 pg (ref 26.6–33.0)
MCHC: 32.9 g/dL (ref 31.5–35.7)
MCV: 91 fL (ref 79–97)
Platelets: 158 x10E3/uL (ref 150–450)
RBC: 4.86 x10E6/uL (ref 3.77–5.28)
RDW: 13.7 % (ref 11.7–15.4)
WBC: 7.9 x10E3/uL (ref 3.4–10.8)

## 2024-04-28 LAB — COMPREHENSIVE METABOLIC PANEL WITH GFR
ALT: 20 IU/L (ref 0–32)
AST: 29 IU/L (ref 0–40)
Albumin: 4.9 g/dL (ref 3.9–4.9)
Alkaline Phosphatase: 109 IU/L (ref 49–135)
BUN/Creatinine Ratio: 11 — ABNORMAL LOW (ref 12–28)
BUN: 8 mg/dL (ref 8–27)
Bilirubin Total: 0.5 mg/dL (ref 0.0–1.2)
CO2: 20 mmol/L (ref 20–29)
Calcium: 10.3 mg/dL (ref 8.7–10.3)
Chloride: 103 mmol/L (ref 96–106)
Creatinine, Ser: 0.72 mg/dL (ref 0.57–1.00)
Globulin, Total: 2.4 g/dL (ref 1.5–4.5)
Glucose: 81 mg/dL (ref 70–99)
Potassium: 4.3 mmol/L (ref 3.5–5.2)
Sodium: 142 mmol/L (ref 134–144)
Total Protein: 7.3 g/dL (ref 6.0–8.5)
eGFR: 94 mL/min/1.73 (ref >59)

## 2024-04-28 LAB — LIPID PANEL
Chol/HDL Ratio: 2.3 ratio (ref 0.0–4.4)
Cholesterol, Total: 202 mg/dL — ABNORMAL HIGH (ref 100–199)
HDL: 88 mg/dL (ref >39)
LDL Chol Calc (NIH): 101 mg/dL — ABNORMAL HIGH (ref 0–99)
Triglycerides: 71 mg/dL (ref 0–149)
VLDL Cholesterol Cal: 13 mg/dL (ref 5–40)

## 2024-04-28 LAB — TSH: TSH: 2.14 u[IU]/mL (ref 0.450–4.500)

## 2024-04-29 ENCOUNTER — Ambulatory Visit (HOSPITAL_BASED_OUTPATIENT_CLINIC_OR_DEPARTMENT_OTHER): Payer: Self-pay | Admitting: Family

## 2024-04-29 DIAGNOSIS — I25119 Atherosclerotic heart disease of native coronary artery with unspecified angina pectoris: Secondary | ICD-10-CM

## 2024-04-29 DIAGNOSIS — E7800 Pure hypercholesterolemia, unspecified: Secondary | ICD-10-CM

## 2024-04-29 DIAGNOSIS — I25118 Atherosclerotic heart disease of native coronary artery with other forms of angina pectoris: Secondary | ICD-10-CM

## 2024-04-29 DIAGNOSIS — E785 Hyperlipidemia, unspecified: Secondary | ICD-10-CM

## 2024-04-29 DIAGNOSIS — Z79899 Other long term (current) drug therapy: Secondary | ICD-10-CM

## 2024-04-29 MED ORDER — PRAVASTATIN SODIUM 40 MG PO TABS: 40.0000 mg | ORAL_TABLET | Freq: Every evening | ORAL | 2 refills | Status: AC

## 2024-04-29 NOTE — Telephone Encounter (Signed)
-----   Message from Reche GORMAN Finder sent at 04/29/2024  8:31 AM EST ----- Normal kidneys, liver, electrolytes.  CBC with no evidence of anemia nor infection.  Normal thyroid . Good result!   LDL (bad cholesterol)  not at goal <70. Increase Pravastatin  to 40mg  daily with repeat FLP/ALT in 2-3 months.  ----- Message ----- From: Interface, [facility] Lab Results In Sent: 04/28/2024   4:36 AM EST To: Reche GORMAN Finder, NP

## 2024-04-29 NOTE — Telephone Encounter (Signed)
 The patient has been notified of the result and verbalized understanding.  All questions (if any) were answered.  Pt aware to increase her pravastatin  to 40 mg po daily and mark down to come back into the lab for repeat fasting lipids/ALT in 2-3 months.  Pt aware to come fasting to this lab appointment.  Confirmed the pharmacy of choice with the pt.   Pt requested I send a copy of this lab report to her PCP office.  Will route this request to PCP office on file.   Pt verbalized understanding and agrees with this plan.

## 2024-05-10 ENCOUNTER — Ambulatory Visit

## 2024-05-10 VITALS — BP 120/86 | HR 90 | Temp 98.2°F | Ht 60.0 in | Wt 109.5 lb

## 2024-05-10 DIAGNOSIS — J441 Chronic obstructive pulmonary disease with (acute) exacerbation: Secondary | ICD-10-CM | POA: Insufficient documentation

## 2024-05-10 DIAGNOSIS — E038 Other specified hypothyroidism: Secondary | ICD-10-CM

## 2024-05-10 DIAGNOSIS — Z862 Personal history of diseases of the blood and blood-forming organs and certain disorders involving the immune mechanism: Secondary | ICD-10-CM

## 2024-05-10 DIAGNOSIS — F419 Anxiety disorder, unspecified: Secondary | ICD-10-CM

## 2024-05-10 DIAGNOSIS — Z7689 Persons encountering health services in other specified circumstances: Secondary | ICD-10-CM

## 2024-05-10 MED ORDER — ALPRAZOLAM 1 MG PO TABS: 1.0000 mg | ORAL_TABLET | Freq: Two times a day (BID) | ORAL | 1 refills | Status: AC | PRN

## 2024-05-10 MED ORDER — BREZTRI AEROSPHERE 160-9-4.8 MCG/ACT IN AERO: 2.0000 | INHALATION_SPRAY | Freq: Two times a day (BID) | RESPIRATORY_TRACT | 6 refills | Status: AC

## 2024-05-10 MED ORDER — ALBUTEROL SULFATE HFA 108 (90 BASE) MCG/ACT IN AERS: 1.0000 | INHALATION_SPRAY | Freq: Four times a day (QID) | RESPIRATORY_TRACT | 3 refills | Status: AC | PRN

## 2024-05-10 MED ORDER — ALBUTEROL SULFATE 2 MG PO TABS: 2.0000 mg | ORAL_TABLET | Freq: Three times a day (TID) | ORAL | 1 refills | Status: AC

## 2024-05-10 MED ORDER — ESCITALOPRAM OXALATE 20 MG PO TABS: 20.0000 mg | ORAL_TABLET | Freq: Every day | ORAL | 3 refills | Status: AC

## 2024-05-10 MED ORDER — AZITHROMYCIN 250 MG PO TABS: ORAL_TABLET | ORAL | 0 refills | Status: AC

## 2024-05-10 MED ORDER — BUSPIRONE HCL 7.5 MG PO TABS: 7.5000 mg | ORAL_TABLET | Freq: Two times a day (BID) | ORAL | 2 refills | Status: AC

## 2024-05-10 MED ORDER — LEVOTHYROXINE SODIUM 50 MCG PO TABS: 75.0000 ug | ORAL_TABLET | Freq: Every day | ORAL | 3 refills | Status: AC

## 2024-05-10 MED ORDER — METHYLPREDNISOLONE ACETATE 40 MG/ML IJ SUSP
40.0000 mg | Freq: Once | INTRAMUSCULAR | Status: AC
  Administered 2024-05-10: 14:00:00 40 mg via INTRAMUSCULAR

## 2024-05-10 NOTE — Progress Notes (Unsigned)
 New Patient Office Visit  Subjective    Patient ID: Wendy Bryan, female    DOB: 1960/06/08  Age: 63 y.o. MRN: [id number]  HPI Wendy Bryan presents to establish care. Follows up with cardiology regularly. Last appointment with cardiology was 03/15/2024, where she also had updated labs. Has a future initial appointment with psychiatry on 07/11/2024. Would like refills that her prior PCP was managing.   Has a significant history of anxiety and depression. Currently takes Lexapro , Xanax  1 mg three times daily, and Abilify as needed for sleep. Says that she is suffering a great deal of stress. Recently lost her dad in May of 2025.  Takes care of her husband full-time, as he has an autoimmune disorder and requires care.  Also has a son past history of addiction issues.  Expresses that she is having increased panic attacks since her dad passing away.  Is having to take the Xanax  frequently due to these panic attacks.  Endorses low energy, fatigue and lack of motivation.  Has also been having trouble falling asleep due to racing thoughts, and takes Xanax . Says that it helps. Would like help getting her anxiety levels down to a more manageable level.  Has also been experiencing increased cough, wheezing, and sore throat since Sunday.  Denies any sick contacts. Denies fever, sinus congestion, body aches or chills. Has not taken any OTC medication for this. Has increased albuterol  to taking four times per day for wheezing and cough. Finds that she is getting short of breath doing chores around the house. Takes Breztri  inhaler as prescribed, and said that this helps with her symptoms.     Past Medical History:  Diagnosis Date   Anxiety 03/17/2022   Chest pain of uncertain etiology 10/15/2021   Essential hypertension 03/17/2022   No pertinent past medical history    Pure hypercholesterolemia 10/15/2021    Past Surgical History:  Procedure Laterality Date   ABDOMINAL HYSTERECTOMY      APPENDECTOMY     CHOLECYSTECTOMY     COLONOSCOPY  02/19/2012   Procedure: COLONOSCOPY;  Surgeon: Lamar CHRISTELLA Hollingshead, MD;  Location: [facility];  Service: Endoscopy;  Laterality: N/A;  9:45 AM   CORONARY ATHERECTOMY N/A 11/25/2021   Procedure: CORONARY ATHERECTOMY;  Surgeon: Wonda Sharper, MD;  Location: [facility] INVASIVE CV LAB;  Service: Cardiovascular;  Laterality: N/A;   CORONARY PRESSURE/FFR STUDY N/A 11/13/2021   Procedure: INTRAVASCULAR PRESSURE WIRE/FFR STUDY;  Surgeon: Wonda Sharper, MD;  Location: [facility] INVASIVE CV LAB;  Service: Cardiovascular;  Laterality: N/A;   CORONARY STENT INTERVENTION N/A 11/25/2021   Procedure: CORONARY STENT INTERVENTION;  Surgeon: Wonda Sharper, MD;  Location: [facility] INVASIVE CV LAB;  Service: Cardiovascular;  Laterality: N/A;   LEFT HEART CATH AND CORONARY ANGIOGRAPHY N/A 11/13/2021   Procedure: LEFT HEART CATH AND CORONARY ANGIOGRAPHY;  Surgeon: Wonda Sharper, MD;  Location: [facility] INVASIVE CV LAB;  Service: Cardiovascular;  Laterality: N/A;    Family History  Problem Relation Age of Onset   Heart disease Mother    Heart failure Mother    Thyroid  disease Father    Hypertension Father    Hyperlipidemia Father    Colon cancer Neg Hx     Social History   Socioeconomic History   Marital status: Married    Spouse name: Not on file   Number of children: Not on file   Years of education: Not on file   Highest education level: Not on file  Occupational History   Not  on file  Tobacco Use   Smoking status: Every Day    Current packs/day: 1.00    Average packs/day: 1 pack/day for 30.0 years (30.0 ttl pk-yrs)    Types: Cigarettes   Smokeless tobacco: Never   Tobacco comments:    4 cigarettes a day  Substance and Sexual Activity   Alcohol use: Yes    Alcohol/week: 0.0 standard drinks of alcohol    Comment: occsional beer and wine   Drug use: No   Sexual activity: Not on file  Other Topics Concern   Not on file  Social History Narrative   Lives with husband  and son in a one story home.  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  Education: high school, some college.   Social Drivers of Health   Tobacco Use: High Risk (05/10/2024)   Patient History    Smoking Tobacco Use: Every Day    Smokeless Tobacco Use: Never    Passive Exposure: Not on file  Financial Resource Strain: Not on file  Food Insecurity: Not on file  Transportation Needs: Not on file  Physical Activity: Not on file  Stress: Not on file  Social Connections: Not on file  Intimate Partner Violence: Not on file  Depression (PHQ2-9): High Risk (05/10/2024)   Depression (PHQ2-9)    PHQ-2 Score: 21  Alcohol Screen: Not on file  Housing: Not on file  Utilities: Not on file  Health Literacy: Not on file    Review of Systems  Constitutional:  Negative for chills, fatigue, fever and unexpected weight change.  HENT:  Positive for rhinorrhea and sore throat. Negative for congestion, sinus pressure and sinus pain.   Respiratory:  Positive for cough, chest tightness, shortness of breath and wheezing.   Cardiovascular:  Negative for chest pain, palpitations and leg swelling.  Psychiatric/Behavioral:  Positive for sleep disturbance. Negative for self-injury and suicidal ideas. The patient is nervous/anxious.      Objective    BP 120/86 (BP Location: Left Arm, Patient Position: Sitting)   Pulse 90   Temp 98.2 F (36.8 C)   Ht 5' (1.524 m)   Wt 109 lb 8 oz (49.7 kg)   SpO2 95%   BMI 21.39 kg/m   Physical Exam Vitals and nursing note reviewed.  Constitutional:      General: She is not in acute distress.    Appearance: Normal appearance. She is not ill-appearing or toxic-appearing.  HENT:     Mouth/Throat:     Pharynx: No oropharyngeal exudate or posterior oropharyngeal erythema.  Cardiovascular:     Rate and Rhythm: Normal rate and regular rhythm.     Heart sounds: Normal heart sounds. No murmur heard. Pulmonary:     Effort: Pulmonary effort is normal. No respiratory distress.      Breath sounds: Wheezing present.     Comments: Scattered bilateral inspiratory and expiratory wheezes at the lung bases.  Lymphadenopathy:     Cervical: No cervical adenopathy.  Skin:    General: Skin is warm and dry.  Neurological:     Mental Status: She is alert.  Psychiatric:        Attention and Perception: Attention normal.        Mood and Affect: Mood is anxious. Affect is flat and tearful.        Speech: Speech normal.        Behavior: Behavior normal.        Thought Content: Thought content does not include homicidal or suicidal  ideation. Thought content does not include homicidal or suicidal plan.        Cognition and Memory: Cognition normal.        Judgment: Judgment normal.       05/10/2024    1:43 PM  Depression screen PHQ 2/9  Decreased Interest 3  Down, Depressed, Hopeless 3  PHQ - 2 Score 6  Altered sleeping 3  Tired, decreased energy 3  Change in appetite 3  Feeling bad or failure about yourself  3  Trouble concentrating 3  Moving slowly or fidgety/restless 0  Suicidal thoughts 0  PHQ-9 Score 21  Difficult doing work/chores Somewhat difficult       05/10/2024    1:43 PM  GAD 7 : Generalized Anxiety Score  Nervous, Anxious, on Edge 3  Control/stop worrying 3  Worry too much - different things 3  Trouble relaxing 3  Restless 2  Easily annoyed or irritable 2  Afraid - awful might happen 3  Total GAD 7 Score 19  Anxiety Difficulty Somewhat difficult   Assessment & Plan:  1. Encounter to establish care (Primary) ***  2. COPD with acute exacerbation (HCC) *** - methylPREDNISolone  acetate (DEPO-MEDROL ) injection 40 mg - albuterol  (PROVENTIL ) 2 MG tablet; Take 1 tablet (2 mg total) by mouth 3 (three) times daily.  Dispense: 90 tablet; Refill: 1 - albuterol  (VENTOLIN  HFA) 108 (90 Base) MCG/ACT inhaler; Inhale 1-2 puffs into the lungs every 6 (six) hours as needed for shortness of breath or wheezing.  Dispense: 1 each; Refill: 3 - BREZTRI   AEROSPHERE 160-9-4.8 MCG/ACT AERO inhaler; Inhale 2 puffs into the lungs 2 (two) times daily.  Dispense: 10.7 g; Refill: 6 - azithromycin  (ZITHROMAX ) 250 MG tablet; Take 2 tablets on day 1, then 1 tablet daily on days 2 through 5  Dispense: 6 tablet; Refill: 0  3. Anxiety *** - busPIRone  (BUSPAR ) 7.5 MG tablet; Take 1 tablet (7.5 mg total) by mouth 2 (two) times daily.  Dispense: 60 tablet; Refill: 2 - escitalopram  (LEXAPRO ) 20 MG tablet; Take 1 tablet (20 mg total) by mouth daily.  Dispense: 30 tablet; Refill: 3 - ALPRAZolam  (XANAX ) 1 MG tablet; Take 1 tablet (1 mg total) by mouth 2 (two) times daily as needed for anxiety.  Dispense: 60 tablet; Refill: 1  4. Other specified hypothyroidism *** - levothyroxine  (SYNTHROID ) 50 MCG tablet; Take 1.5 tablets (75 mcg total) by mouth daily before breakfast.  Dispense: 45 tablet; Refill: 3  5. History of anemia due to vitamin B12 deficiency *** - Vitamin B12  Return in about 3 months (around 08/08/2024).   Damien KATHEE Pringle, FNP

## 2024-05-11 LAB — VITAMIN B12: Vitamin B-12: 231 pg/mL — ABNORMAL LOW (ref 232–1245)

## 2024-05-21 ENCOUNTER — Other Ambulatory Visit: Payer: Self-pay

## 2024-07-11 ENCOUNTER — Ambulatory Visit (HOSPITAL_COMMUNITY): Admitting: Registered Nurse

## 2024-08-01 ENCOUNTER — Ambulatory Visit: Admitting: Physician Assistant

## 2024-08-08 ENCOUNTER — Ambulatory Visit: Admitting: Physician Assistant

## 2024-08-08 ENCOUNTER — Ambulatory Visit

## 2025-01-09 ENCOUNTER — Ambulatory Visit: Admitting: Physician Assistant
